# Patient Record
Sex: Female | Born: 1980 | Race: Black or African American | Hispanic: No | Marital: Single | Smoking: Never smoker
Health system: Southern US, Community
[De-identification: ages and names within clinical notes are randomized; demographics above are authoritative.]

---

## 2003-03-10 ENCOUNTER — Other Ambulatory Visit: Admission: RE | Admit: 2003-03-10 | Discharge: 2003-03-10 | Payer: Self-pay | Admitting: Internal Medicine

## 2006-11-15 ENCOUNTER — Emergency Department (HOSPITAL_COMMUNITY): Admission: EM | Admit: 2006-11-15 | Discharge: 2006-11-15 | Payer: Self-pay | Admitting: Emergency Medicine

## 2007-11-20 ENCOUNTER — Encounter (INDEPENDENT_AMBULATORY_CARE_PROVIDER_SITE_OTHER): Payer: Self-pay | Admitting: Obstetrics and Gynecology

## 2007-11-20 ENCOUNTER — Ambulatory Visit (HOSPITAL_COMMUNITY): Admission: RE | Admit: 2007-11-20 | Discharge: 2007-11-20 | Payer: Self-pay | Admitting: Obstetrics and Gynecology

## 2010-05-29 NOTE — H&P (Signed)
NAME:  Kimberly Curry, Kimberly Curry                ACCOUNT NO.:  [id number]   MEDICAL RECORD NO.:  [id number]        PATIENT TYPE:  WAMB   LOCATION:                                FACILITY:  [facility]   PHYSICIAN:  Kendra H. Tenny Craw, MD          DATE OF BIRTH:   DATE OF ADMISSION:  11/20/2007  DATE OF DISCHARGE:                              HISTORY & PHYSICAL   CHIEF COMPLAINT:  Missed abortion.   HISTORY OF PRESENT ILLNESS:  Kimberly Curry is a 30 year old G1, P0 who  presented for initiation of prenatal care on November 19, 2007.  Her last  menstrual period was September 02, 2007, which should have given her an  estimated date of confinement of Jun 08, 2008, for an estimated  gestational age of [redacted] weeks and 1 day.  An ultrasound was performed  which demonstrated an intrauterine pregnancy with a crown-rump length  measuring 9 weeks and 1 day with no perceivable fetal cardiac activity  consistent with a 9 week missed abortion.  Kimberly Curry was informed of  these findings.  Risks, benefits and alternatives of expectant  management and medical management versus surgical management were  discussed with the patient and Kimberly Curry elected to proceed with surgical  management.  Kimberly Curry presents on November 20, 2007, for suction D and C for a  9 week missed abortion.   PAST MEDICAL HISTORY:  1. Low back injury.  2. Torn knee cartilage on the left.   PAST SURGICAL HISTORY:  Wisdom teeth.   PAST OBSTETRICAL HISTORY:  Current.   PAST GYN HISTORY:  No abnormal Pap smear, no sexually transmitted  diseases.   SOCIAL HISTORY:  Quit tobacco in July of 2009, previously used alcohol  and marijuana but has since discontinued upon discovery of pregnancy.   FAMILY HISTORY:  The patient's brother has sickle cell trait.  Kimberly Curry  herself has never been tested.  No family history of birth defects or  mental retardation.  No acute defects, cystic fibrosis, Jewish or [place] descent.   PHYSICAL EXAM:  VITAL SIGNS:  Kimberly Curry is a 5 feet 8  inches tall, 178 pounds.  Blood pressure is 100/64.  GENERAL:  Kimberly Curry is alert and oriented x3 in no apparent distress.  NECK:  Neck is supple.  No thyromegaly.  LUNGS:  Clear to auscultation bilaterally.  HEART:  Regular rate and rhythm.  No murmurs, gallops or rubs.  ABDOMEN:  Soft, nontender, nondistended.  On bimanual exam uterus is  smaller than expected but normal external female genitalia.  Vagina is  rugated without lesions.  Cervix - no cervical motion tenderness.  No  lesions.  Adnexa nontender, nonpalpable bilaterally.   Transvaginal ultrasound again demonstrates the above findings of a 9  week intrauterine pregnancy with no fetal cardiac activity.  Adnexa are  normal bilaterally.   ASSESSMENT AND PLAN:  This is a 30 year old G1, P0 who presents for  suction D and C for a 9 week missed abortion.  Labs will be obtained on  the date of surgery.  Freddrick March. Tenny Craw, MD  Electronically Signed     KHR/MEDQ  D:  11/19/2007  T:  11/19/2007  Job:  [id number]

## 2010-05-29 NOTE — Op Note (Signed)
NAME:  Kimberly Curry, Kimberly Curry                ACCOUNT NO.:  [id number]   MEDICAL RECORD NO.:  [id number]          PATIENT TYPE:  AMB   LOCATION:  SDC                           FACILITY:  [facility]   PHYSICIAN:  Kendra H. Tenny Craw, MD     DATE OF BIRTH:  [birth year]   DATE OF PROCEDURE:  11/20/2007  DATE OF DISCHARGE:                               OPERATIVE REPORT   PREOPERATIVE DIAGNOSIS:  A 9-week missed abortion.   POSTOPERATIVE DIAGNOSIS:  A 9-week missed abortion.   PROCEDURE:  Suction, dilation and curettage.   SURGEON:  Freddrick March. Tenny Craw, MD   ASSISTANT:  None.   ANESTHESIA:  MAC.   OPERATIVE FINDINGS:  A 9-week size uterus with products of conception.   SPECIMENS:  Products of conception.   DISPOSITION OF SPECIMENS:  To pathology.   ESTIMATED BLOOD LOSS:  Minimal.   COMPLICATIONS:  None.   PROCEDURE:  [name] is a 30 year old G1, P0 who presented for  initiation of prenatal care on November 19, 2007.  Transvaginal  ultrasound at that time demonstrated a 9-week intrauterine pregnancy  without visible fetal cardiac activity.  Medical, surgical, versus  expectant management were discussed and she elected surgical management.  She presented today for definitive surgical management.  Prior to the  operative case, the patient wished to reverify, no fetal cardiac  activity.  A repeat transabdominal ultrasound was performed in the preop  area, which again demonstrated intrauterine pregnancy with no fetal  cardiac activity.  Following the appropriate informed consent, the  patient was brought to the operating room where she was placed in the  lithotomy position.  MAC anesthesia was administered and found to be  adequate.  She was prepped and draped in the normal sterile fashion.  A  speculum was placed in the vagina 1% plain lidocaine paracervical block  was administered and a single-toothed tenaculum was used to grasp the  anterior lip of the cervix.  The cervix was serially dilated.  A #9  suction curette was then passed transcervical with 3 suction passes and  removal of products of conception.  A sharp curettage was performed with  no further removal of products of conception.  A final suction curetting  was  performed.  This completed the procedure.  The single-toothed tenaculum  was removed from the anterior lip of the cervix.  No bleeding was noted.  The speculum was removed from the vagina.  The patient was brought to  the recovery room in stable condition following the procedure.      Freddrick March. Tenny Craw, MD  Electronically Signed     KHR/MEDQ  D:  11/20/2007  T:  11/20/2007  Job:  [id number]

## 2010-10-16 LAB — APTT: aPTT: 26

## 2010-10-16 LAB — CBC
Hemoglobin: 10.3 — ABNORMAL LOW
MCHC: 32.4
Platelets: 239
WBC: 5.5

## 2010-10-16 LAB — TYPE AND SCREEN

## 2010-10-16 LAB — PROTIME-INR: Prothrombin Time: 13.8

## 2010-10-23 LAB — CBC
HCT: 37.8
Hemoglobin: 12.5
MCHC: 33
MCV: 88.9
Platelets: 273
RDW: 14

## 2010-10-23 LAB — I-STAT 8, (EC8 V) (CONVERTED LAB)
Acid-base deficit: 3 — ABNORMAL HIGH
BUN: 7
Chloride: 108
HCT: 45
Operator id: 196461
Sodium: 140
TCO2: 22
pH, Ven: 7.386 — ABNORMAL HIGH

## 2010-10-23 LAB — POCT I-STAT CREATININE: Creatinine, Ser: 0.9

## 2010-10-23 LAB — DIFFERENTIAL: Lymphocytes Relative: 32

## 2010-10-23 LAB — POCT CARDIAC MARKERS: Operator id: 196461

## 2011-12-29 ENCOUNTER — Emergency Department (HOSPITAL_BASED_OUTPATIENT_CLINIC_OR_DEPARTMENT_OTHER): Payer: Self-pay

## 2011-12-29 ENCOUNTER — Encounter (HOSPITAL_BASED_OUTPATIENT_CLINIC_OR_DEPARTMENT_OTHER): Payer: Self-pay | Admitting: *Deleted

## 2011-12-29 ENCOUNTER — Emergency Department (HOSPITAL_BASED_OUTPATIENT_CLINIC_OR_DEPARTMENT_OTHER)
Admission: EM | Admit: 2011-12-29 | Discharge: 2011-12-29 | Disposition: A | Discharge status: Home / Self Care | Payer: Self-pay | Attending: Emergency Medicine | Admitting: Emergency Medicine

## 2011-12-29 DIAGNOSIS — R109 Unspecified abdominal pain: Secondary | ICD-10-CM | POA: Insufficient documentation

## 2011-12-29 DIAGNOSIS — R1110 Vomiting, unspecified: Secondary | ICD-10-CM

## 2011-12-29 DIAGNOSIS — R61 Generalized hyperhidrosis: Secondary | ICD-10-CM | POA: Insufficient documentation

## 2011-12-29 DIAGNOSIS — Z3202 Encounter for pregnancy test, result negative: Secondary | ICD-10-CM | POA: Insufficient documentation

## 2011-12-29 LAB — BASIC METABOLIC PANEL
Calcium: 9.2 mg/dL (ref 8.4–10.5)
Chloride: 108 mEq/L (ref 96–112)
Creatinine, Ser: 0.7 mg/dL (ref 0.50–1.10)
Potassium: 3.6 mEq/L (ref 3.5–5.1)
Sodium: 142 mEq/L (ref 135–145)

## 2011-12-29 LAB — URINALYSIS, ROUTINE W REFLEX MICROSCOPIC
Ketones, ur: 15 mg/dL — AB
Leukocytes, UA: NEGATIVE
Nitrite: NEGATIVE
Protein, ur: NEGATIVE mg/dL
Urobilinogen, UA: 0.2 mg/dL (ref 0.0–1.0)

## 2011-12-29 LAB — URINE MICROSCOPIC-ADD ON

## 2011-12-29 LAB — CBC WITH DIFFERENTIAL/PLATELET
Basophils Absolute: 0 10*3/uL (ref 0.0–0.1)
Eosinophils Absolute: 0 10*3/uL (ref 0.0–0.7)
Lymphs Abs: 1.1 10*3/uL (ref 0.7–4.0)
MCH: 28.9 pg (ref 26.0–34.0)
Monocytes Relative: 4 % (ref 3–12)
WBC: 8.7 10*3/uL (ref 4.0–10.5)

## 2011-12-29 LAB — PREGNANCY, URINE: Preg Test, Ur: NEGATIVE

## 2011-12-29 MED ORDER — PROMETHAZINE HCL 25 MG/ML IJ SOLN
12.5000 mg | Freq: Once | INTRAMUSCULAR | Status: AC
  Administered 2011-12-29: 12.5 mg via INTRAVENOUS
  Filled 2011-12-29: qty 1

## 2011-12-29 MED ORDER — ONDANSETRON 8 MG PO TBDP
8.0000 mg | ORAL_TABLET | Freq: Once | ORAL | Status: AC
  Administered 2011-12-29: 8 mg via ORAL
  Filled 2011-12-29: qty 1

## 2011-12-29 MED ORDER — SODIUM CHLORIDE 0.9 % IV BOLUS (SEPSIS)
1000.0000 mL | Freq: Once | INTRAVENOUS | Status: AC
  Administered 2011-12-29: 1000 mL via INTRAVENOUS

## 2011-12-29 MED ORDER — HYDROCODONE-ACETAMINOPHEN 5-325 MG PO TABS: 1.0000 | ORAL_TABLET | Freq: Four times a day (QID) | ORAL | Status: DC | PRN

## 2011-12-29 MED ORDER — HYDROMORPHONE HCL PF 1 MG/ML IJ SOLN
1.0000 mg | Freq: Once | INTRAMUSCULAR | Status: AC
  Administered 2011-12-29: 1 mg via INTRAVENOUS
  Filled 2011-12-29: qty 1

## 2011-12-29 MED ORDER — IOHEXOL 300 MG/ML  SOLN
25.0000 mL | INTRAMUSCULAR | Status: AC
  Administered 2011-12-29 (×2): 25 mL via ORAL

## 2011-12-29 MED ORDER — PROMETHAZINE HCL 25 MG PO TABS: 25.0000 mg | ORAL_TABLET | Freq: Four times a day (QID) | ORAL | Status: DC | PRN

## 2011-12-29 MED ORDER — SODIUM CHLORIDE 0.9 % IV SOLN: INTRAVENOUS | Status: DC

## 2011-12-29 MED ORDER — IOHEXOL 300 MG/ML  SOLN
100.0000 mL | Freq: Once | INTRAMUSCULAR | Status: AC | PRN
  Administered 2011-12-29: 100 mL via INTRAVENOUS

## 2011-12-29 NOTE — ED Notes (Signed)
MD at bedside. 

## 2011-12-29 NOTE — ED Provider Notes (Signed)
History  This chart was scribed for Kimberly Jakes, MD by Shari Heritage, ED Scribe. The patient was seen in room MH06/MH06. Patient's care was started at 1512.  CSN: [id number]  Arrival date & time 12/29/11  1423   First MD Initiated Contact with Patient 12/29/11 1512      Chief Complaint  Patient presents with  . Emesis    Patient is a 31 y.o. female presenting with vomiting and abdominal pain. The history is provided by the patient. No language interpreter was used.  Emesis  This is a new problem. The current episode started 12 to 24 hours ago. The problem occurs more than 10 times per day. The problem has not changed since onset.The emesis has an appearance of stomach contents. There has been no fever. Associated symptoms include abdominal pain and sweats. Pertinent negatives include no chills, no cough, no diarrhea, no fever and no headaches.  Abdominal Pain The primary symptoms of the illness include abdominal pain, nausea and vomiting. The primary symptoms of the illness do not include fever, shortness of breath, diarrhea, hematemesis or dysuria. The current episode started 13 to 24 hours ago. The onset of the illness was sudden. The problem has not changed since onset. The abdominal pain began 13 to24 hours ago. The pain came on suddenly. The abdominal pain has been unchanged since its onset. The abdominal pain is located in the suprapubic region. The abdominal pain does not radiate. The severity of the abdominal pain is 6/10. The abdominal pain is relieved by nothing. The abdominal pain is exacerbated by certain positions.  The vomiting began today. Vomiting occurs more than 10 times per day. The emesis contains stomach contents.  Additional symptoms associated with the illness include diaphoresis. Symptoms associated with the illness do not include chills or back pain. Significant associated medical issues do not include GERD or diabetes.     HPI Comments: Kimberly Curry is a 31  y.o. female who presents to the Emergency Department complaining of sudden onset emesis and abdominal pain onset 12.5 hours ago. There is associated nausea and diaphoresis. Patient describes the abdominal pain as cramping, constant, moderate non-radiating, suprapubic abdominal pain. Patient rates pain as 6/10. Pain is not relieved by vomiting. Patient states that she has vomited more than 12 times since the onset. There has been no blood in the emesis. Patient states that the abdominal discomfort woke early this morning and she vomited shortly after. Patient's last episode of emesis was less than 30 minutes ago in the ED. Patient was given Zofran 8 mg after triage. Patient denies diarrhea, dysuria, fever, body aches, sore throat, headaches, rhinorrhea, cough, congestion, neck pain, back pain, SOB, chest pain or rash. LMP is now. Patient has no history of blood clotting disorder. Patient has no known sick contacts at home. Patient reports no significant past medical or surgical history.   History reviewed. No pertinent family history.  History  Substance Use Topics  . Smoking status: Never Smoker   . Smokeless tobacco: Not on file  . Alcohol Use: Yes    OB History    Grav Para Term Preterm Abortions TAB SAB Ect Mult Living                  Review of Systems  Constitutional: Positive for diaphoresis. Negative for fever and chills.  HENT: Negative for congestion, sore throat, rhinorrhea and neck pain.   Eyes: Negative for visual disturbance.  Respiratory: Negative for cough and shortness of breath.  Cardiovascular: Negative for chest pain.  Gastrointestinal: Positive for nausea, vomiting and abdominal pain. Negative for diarrhea and hematemesis.  Genitourinary: Negative for dysuria.  Musculoskeletal: Negative for back pain.  Skin: Negative for rash.  Neurological: Negative for headaches.    Allergies  Review of patient's allergies indicates no known allergies.  Home Medications    Current Outpatient Rx  Name  Route  Sig  Dispense  Refill  . HYDROCODONE-ACETAMINOPHEN 5-325 MG PO TABS   Oral   Take 1-2 tablets by mouth every 6 (six) hours as needed for pain.   14 tablet   0   . PROMETHAZINE HCL 25 MG PO TABS   Oral   Take 1 tablet (25 mg total) by mouth every 6 (six) hours as needed for nausea.   12 tablet   0     Triage Vitals: BP 108/72  Pulse 60  Temp 99 F (37.2 C) (Oral)  Resp 18  Ht 5\' 9"  (1.753 m)  Wt 200 lb (90.719 kg)  BMI 29.53 kg/m2  SpO2 98%  LMP 12/29/2011  Physical Exam  Constitutional: She is oriented to person, place, and time. She appears well-developed and well-nourished.  HENT:  Head: Normocephalic and atraumatic.  Mouth/Throat: Oropharynx is clear and moist and mucous membranes are normal. No oropharyngeal exudate or posterior oropharyngeal erythema.  Eyes: Conjunctivae normal and EOM are normal. Pupils are equal, round, and reactive to light.  Neck: Neck supple.  Cardiovascular: Normal rate and regular rhythm.   No murmur heard. Pulmonary/Chest: Effort normal and breath sounds normal. No respiratory distress. She has no wheezes. She has no rales.  Abdominal: Soft. Normal appearance and bowel sounds are normal. There is tenderness. There is no rebound and no guarding.       Tenderness to lower quadrant.  Musculoskeletal: She exhibits no edema and no tenderness.  Neurological: She is alert and oriented to person, place, and time.  Skin: Skin is warm and dry. No rash noted.    ED Course  Procedures (including critical care time) DIAGNOSTIC STUDIES: Oxygen Saturation is 98% on room air, normal by my interpretation.    COORDINATION OF CARE: 3:35 PM- Patient informed of current plan for treatment and evaluation and agrees with plan at this time. Patient was given Zofran 8 mg prior to my exam at 1445. Will order CT of abdomen, CBC, basic metabolic panel, urinalysis and pregnancy test.   4:00 PM- Additional medication orders:  sodium chloride 0.9 % bolus 1,000 mL Once, HYDROmorphone (DILAUDID) injection 1 mg Once, promethazine (PHENERGAN) injection 12.5 mg Once.  Results for orders placed during the hospital encounter of 12/29/11  URINALYSIS, ROUTINE W REFLEX MICROSCOPIC      Component Value Range   Color, Urine YELLOW  YELLOW   APPearance CLOUDY (*) CLEAR   Specific Gravity, Urine 1.027  1.005 - 1.030   pH 6.5  5.0 - 8.0   Glucose, UA NEGATIVE  NEGATIVE mg/dL   Hgb urine dipstick LARGE (*) NEGATIVE   Bilirubin Urine NEGATIVE  NEGATIVE   Ketones, ur 15 (*) NEGATIVE mg/dL   Protein, ur NEGATIVE  NEGATIVE mg/dL   Urobilinogen, UA 0.2  0.0 - 1.0 mg/dL   Nitrite NEGATIVE  NEGATIVE   Leukocytes, UA NEGATIVE  NEGATIVE  PREGNANCY, URINE      Component Value Range   Preg Test, Ur NEGATIVE  NEGATIVE  CBC WITH DIFFERENTIAL      Component Value Range   WBC 8.7  4.0 - 10.5 K/uL  RBC 4.02  3.87 - 5.11 MIL/uL   Hemoglobin 11.6 (*) 12.0 - 15.0 g/dL   HCT 57.8 (*) 46.9 - 62.9 %   MCV 85.1  78.0 - 100.0 fL   MCH 28.9  26.0 - 34.0 pg   MCHC 33.9  30.0 - 36.0 g/dL   RDW 52.8  41.3 - 24.4 %   Platelets 280  150 - 400 K/uL   Neutrophils Relative 83 (*) 43 - 77 %   Neutro Abs 7.2  1.7 - 7.7 K/uL   Lymphocytes Relative 13  12 - 46 %   Lymphs Abs 1.1  0.7 - 4.0 K/uL   Monocytes Relative 4  3 - 12 %   Monocytes Absolute 0.3  0.1 - 1.0 K/uL   Eosinophils Relative 0  0 - 5 %   Eosinophils Absolute 0.0  0.0 - 0.7 K/uL   Basophils Relative 0  0 - 1 %   Basophils Absolute 0.0  0.0 - 0.1 K/uL  BASIC METABOLIC PANEL      Component Value Range   Sodium 142  135 - 145 mEq/L   Potassium 3.6  3.5 - 5.1 mEq/L   Chloride 108  96 - 112 mEq/L   CO2 21  19 - 32 mEq/L   Glucose, Bld 171 (*) 70 - 99 mg/dL   BUN 10  6 - 23 mg/dL   Creatinine, Ser 0.10  0.50 - 1.10 mg/dL   Calcium 9.2  8.4 - 27.2 mg/dL   GFR calc non Af Amer >90  >90 mL/min   GFR calc Af Amer >90  >90 mL/min  URINE MICROSCOPIC-ADD ON      Component Value Range    Squamous Epithelial / LPF FEW (*) RARE   WBC, UA 0-2  <3 WBC/hpf   RBC / HPF TOO NUMEROUS TO COUNT  <3 RBC/hpf   Bacteria, UA FEW (*) RARE    Ct Abdomen Pelvis W Contrast  12/29/2011  *RADIOLOGY REPORT*  Clinical Data: Abdominal pain and vomiting.  CT ABDOMEN AND PELVIS WITH CONTRAST  Technique:  Multidetector CT imaging of the abdomen and pelvis was performed following the standard protocol during bolus administration of intravenous contrast.  Contrast: OMNIPAQUE IOHEXOL 300 MG/ML  SOLN  Comparison: None.  Findings: The liver, spleen, pancreas, adrenal glands, and kidneys are normal.  There is at least one cholesterol stone in the nondistended gallbladder.  No bile duct dilatation.  The terminal ileum and appendix are normal.  Small bowel is normal. Colon appears normal and is devoid of stool.  Uterus and ovaries are normal except for an 11 mm cyst on the left ovary. No osseous abnormality.  IMPRESSION: Benign-appearing abdomen and pelvis except for the presence of a cholesterol gallstone.   Original Report Authenticated By: Francene Boyers, M.D.      1. Vomiting   2. Abdominal pain       MDM  Patient currently on her menses that explains the hematuria in the urine. CT scan without any significant findings. Patient with acute onset of vomiting at 3 in the morning with generalized bowel pain somewhat increased in the suprapubic area. Patient's menstrual period is on time. Progress he test was negative. Suspect that patient has a viral gastritis we'll treat with anti-medics and pain medicine will followup if not improving in the next 24 hours. Patient's abdomen is soft no significant tenderness to palpation. 80 some slight tenderness increased in the lower quadrants.      I personally performed  the services described in this documentation, which was scribed in my presence. The recorded information has been reviewed and is accurate.     Kimberly Jakes, MD 12/29/11 2012

## 2011-12-29 NOTE — ED Notes (Signed)
Vomiting since 0300. Hyperventilating at triage. Encouraged to slow breathing.

## 2012-03-31 ENCOUNTER — Encounter (HOSPITAL_BASED_OUTPATIENT_CLINIC_OR_DEPARTMENT_OTHER): Payer: Self-pay | Admitting: *Deleted

## 2012-03-31 ENCOUNTER — Emergency Department (HOSPITAL_BASED_OUTPATIENT_CLINIC_OR_DEPARTMENT_OTHER)
Admission: EM | Admit: 2012-03-31 | Discharge: 2012-04-01 | Disposition: A | Discharge status: Home / Self Care | Payer: Self-pay | Attending: Emergency Medicine | Admitting: Emergency Medicine

## 2012-03-31 DIAGNOSIS — N898 Other specified noninflammatory disorders of vagina: Secondary | ICD-10-CM | POA: Insufficient documentation

## 2012-03-31 DIAGNOSIS — N760 Acute vaginitis: Secondary | ICD-10-CM | POA: Insufficient documentation

## 2012-03-31 DIAGNOSIS — Z3202 Encounter for pregnancy test, result negative: Secondary | ICD-10-CM | POA: Insufficient documentation

## 2012-03-31 DIAGNOSIS — B9689 Other specified bacterial agents as the cause of diseases classified elsewhere: Secondary | ICD-10-CM

## 2012-03-31 DIAGNOSIS — E876 Hypokalemia: Secondary | ICD-10-CM | POA: Insufficient documentation

## 2012-03-31 DIAGNOSIS — R112 Nausea with vomiting, unspecified: Secondary | ICD-10-CM | POA: Insufficient documentation

## 2012-03-31 DIAGNOSIS — R109 Unspecified abdominal pain: Secondary | ICD-10-CM | POA: Insufficient documentation

## 2012-03-31 LAB — URINALYSIS, ROUTINE W REFLEX MICROSCOPIC
Glucose, UA: NEGATIVE mg/dL
Leukocytes, UA: NEGATIVE
Protein, ur: NEGATIVE mg/dL
Specific Gravity, Urine: 1.017 (ref 1.005–1.030)
Urobilinogen, UA: 1 mg/dL (ref 0.0–1.0)
pH: 5.5 (ref 5.0–8.0)

## 2012-03-31 LAB — CBC WITH DIFFERENTIAL/PLATELET
Basophils Absolute: 0 10*3/uL (ref 0.0–0.1)
Lymphocytes Relative: 28 % (ref 12–46)
Lymphs Abs: 2.8 10*3/uL (ref 0.7–4.0)
MCV: 85.3 fL (ref 78.0–100.0)
Neutro Abs: 6 10*3/uL (ref 1.7–7.7)
Neutrophils Relative %: 60 % (ref 43–77)
Platelets: 305 10*3/uL (ref 150–400)
RBC: 4.62 MIL/uL (ref 3.87–5.11)
RDW: 12.5 % (ref 11.5–15.5)
WBC: 9.9 10*3/uL (ref 4.0–10.5)

## 2012-03-31 LAB — COMPREHENSIVE METABOLIC PANEL
ALT: 6 U/L (ref 0–35)
AST: 11 U/L (ref 0–37)
Alkaline Phosphatase: 79 U/L (ref 39–117)
CO2: 27 mEq/L (ref 19–32)
Calcium: 9.6 mg/dL (ref 8.4–10.5)
Chloride: 96 mEq/L (ref 96–112)
GFR calc Af Amer: >90 mL/min (ref >90)
GFR calc non Af Amer: 83 mL/min — ABNORMAL LOW (ref >90)
Glucose, Bld: 111 mg/dL — ABNORMAL HIGH (ref 70–99)
Potassium: 2.8 mEq/L — ABNORMAL LOW (ref 3.5–5.1)
Sodium: 135 mEq/L (ref 135–145)

## 2012-03-31 LAB — WET PREP, GENITAL: Yeast Wet Prep HPF POC: NONE SEEN

## 2012-03-31 LAB — PREGNANCY, URINE: Preg Test, Ur: NEGATIVE

## 2012-03-31 MED ORDER — POTASSIUM CHLORIDE 10 MEQ/100ML IV SOLN
10.0000 meq | Freq: Once | INTRAVENOUS | Status: AC
  Administered 2012-03-31: 10 meq via INTRAVENOUS
  Filled 2012-03-31: qty 100

## 2012-03-31 MED ORDER — METRONIDAZOLE 500 MG PO TABS: 500.0000 mg | ORAL_TABLET | Freq: Two times a day (BID) | ORAL | Status: DC

## 2012-03-31 MED ORDER — SODIUM CHLORIDE 0.9 % IV BOLUS (SEPSIS)
1000.0000 mL | Freq: Once | INTRAVENOUS | Status: AC
  Administered 2012-03-31: 1000 mL via INTRAVENOUS

## 2012-03-31 MED ORDER — POTASSIUM CHLORIDE CRYS ER 20 MEQ PO TBCR
40.0000 meq | EXTENDED_RELEASE_TABLET | Freq: Once | ORAL | Status: AC
  Administered 2012-03-31: 40 meq via ORAL
  Filled 2012-03-31: qty 2

## 2012-03-31 MED ORDER — MORPHINE SULFATE 4 MG/ML IJ SOLN
4.0000 mg | Freq: Once | INTRAMUSCULAR | Status: AC
  Administered 2012-03-31: 4 mg via INTRAVENOUS
  Filled 2012-03-31 (×2): qty 1

## 2012-03-31 MED ORDER — METRONIDAZOLE 500 MG PO TABS
500.0000 mg | ORAL_TABLET | Freq: Once | ORAL | Status: AC
  Administered 2012-03-31: 500 mg via ORAL
  Filled 2012-03-31: qty 1

## 2012-03-31 MED ORDER — HYDROCODONE-ACETAMINOPHEN 5-325 MG PO TABS: 2.0000 | ORAL_TABLET | ORAL | Status: DC | PRN

## 2012-03-31 MED ORDER — MORPHINE SULFATE 4 MG/ML IJ SOLN
6.0000 mg | Freq: Once | INTRAMUSCULAR | Status: AC
  Administered 2012-03-31: 4 mg via INTRAVENOUS
  Filled 2012-03-31: qty 1

## 2012-03-31 MED ORDER — SODIUM CHLORIDE 0.9 % IV BOLUS (SEPSIS): 1000.0000 mL | Freq: Once | INTRAVENOUS | Status: DC

## 2012-03-31 MED ORDER — MORPHINE SULFATE 2 MG/ML IJ SOLN
INTRAMUSCULAR | Status: AC
  Administered 2012-03-31: 2 mg via INTRAVASCULAR
  Filled 2012-03-31: qty 1

## 2012-03-31 MED ORDER — PROMETHAZINE HCL 25 MG PO TABS: 25.0000 mg | ORAL_TABLET | Freq: Four times a day (QID) | ORAL | Status: DC | PRN

## 2012-03-31 MED ORDER — ONDANSETRON HCL 4 MG/2ML IJ SOLN
4.0000 mg | Freq: Once | INTRAMUSCULAR | Status: AC
  Administered 2012-03-31: 4 mg via INTRAVENOUS
  Filled 2012-03-31 (×2): qty 2

## 2012-03-31 NOTE — ED Notes (Signed)
Abdominal pain x 4 days. Vomiting.

## 2012-03-31 NOTE — ED Provider Notes (Signed)
Medical screening examination/treatment/procedure(s) were conducted as a shared visit with non-physician practitioner(s) and myself.  I personally evaluated the patient during the encounter  Kimberly Curry is a 32 y.o. female here with lower ab cramps, vomiting. Mild diffuse lower ab tenderness on exam. Vitals stable. UA nl, ucg neg. PA signed out to me to reassess patient. Patient felt better after meds. Tolerated PO. Wet prep showed + clue cells and she is given flagyl. She is also hypokalemic to 2.8, likely from vomiting. K supplemented with Kdur and KCL 10 meq. Will d/c home on norco, phenergan, and flagyl. Return precautions given.    Richardean Canal, MD 03/31/12 2250

## 2012-03-31 NOTE — ED Provider Notes (Signed)
History     CSN: [id number]  Arrival date & time 03/31/12  2057   First MD Initiated Contact with Patient 03/31/12 2106      Chief Complaint  Patient presents with  . Abdominal Pain    (Consider location/radiation/quality/duration/timing/severity/associated sxs/prior treatment) HPI Comments: Vomiting without diarrhea:pt has been afebrile  Patient is a 32 y.o. female presenting with abdominal pain. The history is provided by the patient. No language interpreter was used.  Abdominal Pain Pain location:  LLQ and RLQ Pain quality: aching   Pain radiates to:  Does not radiate Pain severity:  Moderate Onset quality:  Gradual Duration:  4 days Timing:  Constant Progression:  Unchanged Context: not previous surgeries   Relieved by:  Nothing Worsened by:  Nothing tried Ineffective treatments:  None tried Associated symptoms: nausea and vomiting   Associated symptoms: no dysuria and no vaginal discharge     History reviewed. No pertinent past medical history.  History reviewed. No pertinent past surgical history.  No family history on file.  History  Substance Use Topics  . Smoking status: Never Smoker   . Smokeless tobacco: Not on file  . Alcohol Use: Yes    OB History   Grav Para Term Preterm Abortions TAB SAB Ect Mult Living                  Review of Systems  Constitutional: Negative.   Respiratory: Negative.   Cardiovascular: Negative.   Gastrointestinal: Positive for nausea, vomiting and abdominal pain.  Genitourinary: Negative for dysuria and vaginal discharge.    Allergies  Review of patient's allergies indicates no known allergies.  Home Medications   Current Outpatient Rx  Name  Route  Sig  Dispense  Refill  . HYDROcodone-acetaminophen (NORCO/VICODIN) 5-325 MG per tablet   Oral   Take 1-2 tablets by mouth every 6 (six) hours as needed for pain.   14 tablet   0   . promethazine (PHENERGAN) 25 MG tablet   Oral   Take 1 tablet (25 mg total) by  mouth every 6 (six) hours as needed for nausea.   12 tablet   0     BP 93/54  Pulse 102  Temp(Src) 98.2 F (36.8 C) (Oral)  Resp 20  Wt 200 lb (90.719 kg)  BMI 29.52 kg/m2  SpO2 97%  LMP 03/14/2012  Physical Exam  Nursing note and vitals reviewed. Constitutional: She is oriented to person, place, and time. She appears well-developed and well-nourished.  HENT:  Head: Normocephalic and atraumatic.  Eyes: Conjunctivae and EOM are normal. Pupils are equal, round, and reactive to light.  Neck: Normal range of motion. Neck supple.  Cardiovascular: Normal rate and regular rhythm.   Pulmonary/Chest: Effort normal and breath sounds normal.  Abdominal: Soft. Bowel sounds are normal. There is no tenderness.  Genitourinary:  White malodorous discharge:-cmt   Musculoskeletal: Normal range of motion.  Neurological: She is alert and oriented to person, place, and time.  Skin: Skin is warm.  Psychiatric: She has a normal mood and affect.    ED Course  Procedures (including critical care time)  Labs Reviewed  WET PREP, GENITAL - Abnormal; Notable for the following:    Clue Cells Wet Prep HPF POC MODERATE (*)    WBC, Wet Prep HPF POC FEW (*)    All other components within normal limits  URINALYSIS, ROUTINE W REFLEX MICROSCOPIC - Abnormal; Notable for the following:    APPearance CLOUDY (*)    Ketones, ur  15 (*)    All other components within normal limits  CBC WITH DIFFERENTIAL - Abnormal; Notable for the following:    Monocytes Absolute 1.1 (*)    All other components within normal limits  COMPREHENSIVE METABOLIC PANEL - Abnormal; Notable for the following:    Potassium 2.8 (*)    Glucose, Bld 111 (*)    GFR calc non Af Amer 83 (*)    All other components within normal limits  GC/CHLAMYDIA PROBE AMP  PREGNANCY, URINE  LIPASE, BLOOD  RPR   No results found.   No diagnosis found.    MDM  Pt left with Dr. Leslie Andrea, NP 03/31/12 2224

## 2012-03-31 NOTE — ED Notes (Signed)
No active vomiting and no shortness of breath noted.

## 2012-04-02 ENCOUNTER — Emergency Department (HOSPITAL_BASED_OUTPATIENT_CLINIC_OR_DEPARTMENT_OTHER): Payer: Self-pay

## 2012-04-02 ENCOUNTER — Encounter (HOSPITAL_BASED_OUTPATIENT_CLINIC_OR_DEPARTMENT_OTHER): Payer: Self-pay

## 2012-04-02 ENCOUNTER — Inpatient Hospital Stay (HOSPITAL_BASED_OUTPATIENT_CLINIC_OR_DEPARTMENT_OTHER)
Admission: EM | Admit: 2012-04-02 | Discharge: 2012-04-04 | DRG: 419 | Disposition: A | Discharge status: Home / Self Care | Payer: MEDICAID | Attending: General Surgery | Admitting: General Surgery

## 2012-04-02 DIAGNOSIS — K819 Cholecystitis, unspecified: Secondary | ICD-10-CM

## 2012-04-02 DIAGNOSIS — B9689 Other specified bacterial agents as the cause of diseases classified elsewhere: Secondary | ICD-10-CM | POA: Diagnosis present

## 2012-04-02 DIAGNOSIS — E876 Hypokalemia: Secondary | ICD-10-CM | POA: Diagnosis present

## 2012-04-02 DIAGNOSIS — A499 Bacterial infection, unspecified: Secondary | ICD-10-CM | POA: Diagnosis present

## 2012-04-02 DIAGNOSIS — N760 Acute vaginitis: Secondary | ICD-10-CM | POA: Diagnosis present

## 2012-04-02 DIAGNOSIS — K668 Other specified disorders of peritoneum: Secondary | ICD-10-CM | POA: Diagnosis present

## 2012-04-02 DIAGNOSIS — K8000 Calculus of gallbladder with acute cholecystitis without obstruction: Principal | ICD-10-CM | POA: Diagnosis present

## 2012-04-02 DIAGNOSIS — Z79899 Other long term (current) drug therapy: Secondary | ICD-10-CM

## 2012-04-02 DIAGNOSIS — K8010 Calculus of gallbladder with chronic cholecystitis without obstruction: Secondary | ICD-10-CM

## 2012-04-02 LAB — COMPREHENSIVE METABOLIC PANEL
ALT: 7 U/L (ref 0–35)
AST: 11 U/L (ref 0–37)
Albumin: 4.2 g/dL (ref 3.5–5.2)
Alkaline Phosphatase: 74 U/L (ref 39–117)
Chloride: 99 mEq/L (ref 96–112)
Potassium: 3.8 mEq/L (ref 3.5–5.1)
Sodium: 135 mEq/L (ref 135–145)
Total Protein: 7.9 g/dL (ref 6.0–8.3)

## 2012-04-02 LAB — URINALYSIS, ROUTINE W REFLEX MICROSCOPIC
Glucose, UA: NEGATIVE mg/dL
Ketones, ur: 15 mg/dL — AB
Leukocytes, UA: NEGATIVE
Nitrite: NEGATIVE
Protein, ur: 30 mg/dL — AB
pH: 8.5 — ABNORMAL HIGH (ref 5.0–8.0)

## 2012-04-02 LAB — URINE MICROSCOPIC-ADD ON

## 2012-04-02 LAB — CBC WITH DIFFERENTIAL/PLATELET
Basophils Relative: 0 % (ref 0–1)
Eosinophils Absolute: 0 10*3/uL (ref 0.0–0.7)
MCH: 29.1 pg (ref 26.0–34.0)
MCHC: 33.7 g/dL (ref 30.0–36.0)
Neutro Abs: 6.6 10*3/uL (ref 1.7–7.7)
Neutrophils Relative %: 71 % (ref 43–77)
Platelets: 297 10*3/uL (ref 150–400)
RBC: 4.33 MIL/uL (ref 3.87–5.11)

## 2012-04-02 LAB — PREGNANCY, URINE: Preg Test, Ur: NEGATIVE

## 2012-04-02 LAB — LIPASE, BLOOD: Lipase: 26 U/L (ref 11–59)

## 2012-04-02 MED ORDER — PROMETHAZINE HCL 25 MG/ML IJ SOLN
6.2500 mg | Freq: Once | INTRAMUSCULAR | Status: AC
  Administered 2012-04-02: 6.25 mg via INTRAVENOUS
  Filled 2012-04-02: qty 1

## 2012-04-02 MED ORDER — HYDROMORPHONE HCL PF 1 MG/ML IJ SOLN
0.5000 mg | INTRAMUSCULAR | Status: DC | PRN
  Administered 2012-04-02 – 2012-04-04 (×7): 1 mg via INTRAVENOUS
  Filled 2012-04-02 (×8): qty 1

## 2012-04-02 MED ORDER — PROMETHAZINE HCL 25 MG/ML IJ SOLN
25.0000 mg | Freq: Once | INTRAMUSCULAR | Status: AC
  Administered 2012-04-02: 25 mg via INTRAVENOUS
  Filled 2012-04-02: qty 1

## 2012-04-02 MED ORDER — PROMETHAZINE HCL 25 MG/ML IJ SOLN
6.2500 mg | Freq: Four times a day (QID) | INTRAMUSCULAR | Status: DC | PRN
  Administered 2012-04-02 – 2012-04-03 (×3): 12.5 mg via INTRAVENOUS
  Filled 2012-04-02 (×3): qty 1

## 2012-04-02 MED ORDER — SODIUM CHLORIDE 0.9 % IV BOLUS (SEPSIS)
1000.0000 mL | Freq: Once | INTRAVENOUS | Status: AC
  Administered 2012-04-02: 1000 mL via INTRAVENOUS

## 2012-04-02 MED ORDER — DIPHENHYDRAMINE HCL 12.5 MG/5ML PO ELIX: 12.5000 mg | ORAL_SOLUTION | Freq: Four times a day (QID) | ORAL | Status: DC | PRN

## 2012-04-02 MED ORDER — SODIUM CHLORIDE 0.9 % IV SOLN
INTRAVENOUS | Status: DC
  Administered 2012-04-02: 14:00:00 via INTRAVENOUS

## 2012-04-02 MED ORDER — PANTOPRAZOLE SODIUM 40 MG IV SOLR
40.0000 mg | Freq: Once | INTRAVENOUS | Status: AC
  Administered 2012-04-02: 40 mg via INTRAVENOUS
  Filled 2012-04-02: qty 40

## 2012-04-02 MED ORDER — POTASSIUM CHLORIDE IN NACL 40-0.9 MEQ/L-% IV SOLN
INTRAVENOUS | Status: DC
  Administered 2012-04-02: 18:00:00 via INTRAVENOUS
  Administered 2012-04-03 (×2): 125 mL/h via INTRAVENOUS
  Filled 2012-04-02 (×4): qty 1000

## 2012-04-02 MED ORDER — DIPHENHYDRAMINE HCL 50 MG/ML IJ SOLN: 12.5000 mg | Freq: Four times a day (QID) | INTRAMUSCULAR | Status: DC | PRN

## 2012-04-02 MED ORDER — ACETAMINOPHEN 650 MG RE SUPP: 650.0000 mg | Freq: Four times a day (QID) | RECTAL | Status: DC | PRN

## 2012-04-02 MED ORDER — HYDROMORPHONE HCL PF 1 MG/ML IJ SOLN
1.0000 mg | INTRAMUSCULAR | Status: DC | PRN
Start: 2012-04-02 — End: 2012-04-02
  Administered 2012-04-02: 1 mg via INTRAVENOUS
  Filled 2012-04-02: qty 1

## 2012-04-02 MED ORDER — ACETAMINOPHEN 325 MG PO TABS: 650.0000 mg | ORAL_TABLET | Freq: Four times a day (QID) | ORAL | Status: DC | PRN

## 2012-04-02 MED ORDER — LORAZEPAM 2 MG/ML IJ SOLN
1.0000 mg | Freq: Once | INTRAMUSCULAR | Status: AC
  Administered 2012-04-02: 1 mg via INTRAVENOUS
  Filled 2012-04-02: qty 1

## 2012-04-02 MED ORDER — ONDANSETRON HCL 4 MG/2ML IJ SOLN
4.0000 mg | Freq: Once | INTRAMUSCULAR | Status: AC
  Administered 2012-04-02: 4 mg via INTRAVENOUS
  Filled 2012-04-02: qty 2

## 2012-04-02 MED ORDER — OXYCODONE HCL 5 MG PO TABS: 5.0000 mg | ORAL_TABLET | ORAL | Status: DC | PRN

## 2012-04-02 MED ORDER — IOHEXOL 300 MG/ML  SOLN
50.0000 mL | Freq: Once | INTRAMUSCULAR | Status: AC | PRN
  Administered 2012-04-02: 50 mL via ORAL

## 2012-04-02 MED ORDER — CIPROFLOXACIN IN D5W 400 MG/200ML IV SOLN
400.0000 mg | Freq: Two times a day (BID) | INTRAVENOUS | Status: DC
  Administered 2012-04-02 – 2012-04-03 (×2): 400 mg via INTRAVENOUS
  Filled 2012-04-02 (×3): qty 200

## 2012-04-02 MED ORDER — IOHEXOL 300 MG/ML  SOLN
100.0000 mL | Freq: Once | INTRAMUSCULAR | Status: AC | PRN
  Administered 2012-04-02: 100 mL via INTRAVENOUS

## 2012-04-02 MED ORDER — ONDANSETRON HCL 4 MG/2ML IJ SOLN
4.0000 mg | Freq: Three times a day (TID) | INTRAMUSCULAR | Status: AC | PRN
  Administered 2012-04-02: 4 mg via INTRAVENOUS
  Filled 2012-04-02: qty 2

## 2012-04-02 MED ORDER — ONDANSETRON HCL 4 MG/2ML IJ SOLN: 4.0000 mg | Freq: Four times a day (QID) | INTRAMUSCULAR | Status: DC | PRN

## 2012-04-02 MED ORDER — HEPARIN SODIUM (PORCINE) 5000 UNIT/ML IJ SOLN
5000.0000 [IU] | Freq: Three times a day (TID) | INTRAMUSCULAR | Status: DC
  Administered 2012-04-02: 5000 [IU] via SUBCUTANEOUS
  Filled 2012-04-02 (×5): qty 1

## 2012-04-02 NOTE — ED Notes (Signed)
Awaiting admission to [facility]

## 2012-04-02 NOTE — H&P (Signed)
Kimberly Curry is an 32 y.o. female.   Chief Complaint: Nausea,vomiting, abdominal pain HPI: This is a 32 year old female who is in good health who about a week ago she started having nausea and vomiting last Friday. Everything she drank made her sick. She was seen at [facility] last Tuesday 03/31/12 exam was normal except for some clue cells. History Flagyl but continued to have ongoing nausea and vomiting. Because of ongoing symptoms she returned to the ER at [facility]. CT scan shows gallstones seen previously on a CT scan no common bile duct dilatation. There was an omental nodule 1.2 x 0.7 cm no other nodules or nodularity noted. Appendix was normal there was no retroperitoneal adenopathy. Abdominal ultrasound shows gallbladder with stones lodged in the region the gallbladder neck measuring up to 1.2 cm. The gallbladder wall is thickened measuring up to 4.8 mm patient was tender region during scanning by the technologist. Common bile duct measures 3.2 mm. There were no significant findings but this was consistent with acute cholecystitis. Labs shows a normal white count normal LFTs. Lipase is 420 and 26 respectively. Urinalysis shows some protein and ketones but is otherwise unremarkable. Pregnancy test was negative x2. Patient was transferred to [facility] our service for cholecystectomy.  History reviewed. No pertinent past medical history.  History reviewed. No pertinent past surgical history.  Family History  Problem Relation Age of Onset  . Hypertension and a history of migraines. Her mother is also status post lap band procedure Father is living in good health he has diabetes type 2 and hypertension One brother in good health no medical issues at this time.  Mother    Social History:  reports that she has never smoked. She has never used smokeless tobacco. She reports that  drinks alcohol. She reports that she does not use illicit drugs.  Allergies:  Allergies   Allergen Reactions  . Celery Oil Hives     Medications Prior to Admission  Medication Sig Dispense Refill  . HYDROcodone-acetaminophen (NORCO) 5-325 MG per tablet Take 2 tablets by mouth every 4 (four) hours as needed for pain.  10 tablet  0  . metroNIDAZOLE (FLAGYL) 500 MG tablet Take 1 tablet (500 mg total) by mouth 2 (two) times daily. One po bid x 7 days  14 tablet  0  . promethazine (PHENERGAN) 25 MG tablet Take 1 tablet (25 mg total) by mouth every 6 (six) hours as needed for nausea.  12 tablet  0    Results for orders placed during the hospital encounter of 04/02/12 (from the past 48 hour(s))  CBC WITH DIFFERENTIAL     Status: None   Collection Time    04/02/12  8:03 AM      Result Value Range   WBC 9.3  4.0 - 10.5 K/uL   RBC 4.33  3.87 - 5.11 MIL/uL   Hemoglobin 12.6  12.0 - 15.0 g/dL   HCT 96.0  45.4 - 09.8 %   MCV 86.4  78.0 - 100.0 fL   MCH 29.1  26.0 - 34.0 pg   MCHC 33.7  30.0 - 36.0 g/dL   RDW 11.9  14.7 - 82.9 %   Platelets 297  150 - 400 K/uL   Neutrophils Relative 71  43 - 77 %   Neutro Abs 6.6  1.7 - 7.7 K/uL   Lymphocytes Relative 21  12 - 46 %   Lymphs Abs 2.0  0.7 - 4.0 K/uL   Monocytes  Relative 7  3 - 12 %   Monocytes Absolute 0.7  0.1 - 1.0 K/uL   Eosinophils Relative 0  0 - 5 %   Eosinophils Absolute 0.0  0.0 - 0.7 K/uL   Basophils Relative 0  0 - 1 %   Basophils Absolute 0.0  0.0 - 0.1 K/uL  COMPREHENSIVE METABOLIC PANEL     Status: Abnormal   Collection Time    04/02/12  8:03 AM      Result Value Range   Sodium 135  135 - 145 mEq/L   Potassium 3.8  3.5 - 5.1 mEq/L   Comment: DELTA CHECK NOTED   Chloride 99  96 - 112 mEq/L   CO2 24  19 - 32 mEq/L   Glucose, Bld 124 (*) 70 - 99 mg/dL   BUN 6  6 - 23 mg/dL   Creatinine, Ser 1.61  0.50 - 1.10 mg/dL   Calcium 9.5  8.4 - 09.6 mg/dL   Total Protein 7.9  6.0 - 8.3 g/dL   Albumin 4.2  3.5 - 5.2 g/dL   AST 11  0 - 37 U/L   ALT 7  0 - 35 U/L   Alkaline Phosphatase 74  39 - 117 U/L   Total  Bilirubin 0.3  0.3 - 1.2 mg/dL   GFR calc non Af Amer >90  >90 mL/min   GFR calc Af Amer >90  >90 mL/min   Comment:            The eGFR has been calculated     using the CKD EPI equation.     This calculation has not been     validated in all clinical     situations.     eGFR's persistently     <90 mL/min signify     possible Chronic Kidney Disease.  LIPASE, BLOOD     Status: None   Collection Time    04/02/12  8:03 AM      Result Value Range   Lipase 26  11 - 59 U/L  URINALYSIS, ROUTINE W REFLEX MICROSCOPIC     Status: Abnormal   Collection Time    04/02/12  8:43 AM      Result Value Range   Color, Urine YELLOW  YELLOW   APPearance CLEAR  CLEAR   Specific Gravity, Urine 1.015  1.005 - 1.030   pH 8.5 (*) 5.0 - 8.0   Glucose, UA NEGATIVE  NEGATIVE mg/dL   Hgb urine dipstick NEGATIVE  NEGATIVE   Bilirubin Urine NEGATIVE  NEGATIVE   Ketones, ur 15 (*) NEGATIVE mg/dL   Protein, ur 30 (*) NEGATIVE mg/dL   Urobilinogen, UA 0.2  0.0 - 1.0 mg/dL   Nitrite NEGATIVE  NEGATIVE   Leukocytes, UA NEGATIVE  NEGATIVE  PREGNANCY, URINE     Status: None   Collection Time    04/02/12  8:43 AM      Result Value Range   Preg Test, Ur NEGATIVE  NEGATIVE   Comment:            THE SENSITIVITY OF THIS     METHODOLOGY IS >20 mIU/mL.  URINE MICROSCOPIC-ADD ON     Status: None   Collection Time    04/02/12  8:43 AM      Result Value Range   Squamous Epithelial / LPF RARE  RARE   WBC, UA 0-2  <3 WBC/hpf   RBC / HPF 0-2  <3 RBC/hpf   Bacteria, UA RARE  RARE   US Abdomen Complete  04/02/2012  *RADIOLOGY REPORT*  Clinical Data:  Abdominal pain with nausea and vomiting.  COMPLETE ABDOMINAL ULTRASOUND  Comparison:  04/02/2012 CT.  Findings:  Gallbladder:  Two gallstones one of which is lodged in the region of the gallbladder neck measuring up to 1.2 cm.  Gallbladder wall thickening measuring up to 4.8 mm.  The patient was tender over this region during scanning per ultrasound technologist.  Common  bile duct:  Evaluation limited.  Measures up to 3.2 mm.  Liver:  No focal lesion identified.  Within normal limits in parenchymal echogenicity.  IVC:  Limited by bowel gas.  Pancreas:  Limited by bowel gas.  Spleen:  6.2 cm.  No focal mass.  Right Kidney:  10.5 cm. No hydronephrosis or renal mass.  Left Kidney:  10.8 cm. No hydronephrosis or renal mass.  Abdominal aorta:  Limited by bowel gas.  No obvious aneurysm.  IMPRESSION: Two gallstones one of which is lodged in the region of the gallbladder neck measuring up to 1.2 cm.  Gallbladder wall thickening measuring up to 4.8 mm.  The patient was tender over this region during scanning per ultrasound technologist. Findings are suspicious for acute cholecystitis.  Please see above.  Critical Value/emergent results were called by telephone at the time of interpretation on 04/02/2012 at 11:52 a.m. to Dr. Manus Gunning, who verbally acknowledged these results.   Original Report Authenticated By: Lacy Duverney, M.D.    Ct Abdomen Pelvis W Contrast  04/02/2012  *RADIOLOGY REPORT*  Clinical Data: Abdominal pain and vomiting.  CT ABDOMEN AND PELVIS WITH CONTRAST  Technique:  Multidetector CT imaging of the abdomen and pelvis was performed following the standard protocol during bolus administration of intravenous contrast.  Contrast: 50mL OMNIPAQUE IOHEXOL 300 MG/ML  SOLN, OMNIPAQUE IOHEXOL 300 MG/ML  SOLN  Comparison: 12/29/2011  Findings: Stable scarring anteriorly in the left lower lobe. The liver, spleen, pancreas, and adrenal glands appear unremarkable.  The gallstone previously suggested on CT scan is less readily apparent on today's exam.  No CBD dilatation noted.  The kidneys appear unremarkable, as do the proximal ureters.  There is an indistinct omental nodule measuring 1.2 by 0.7 cm on image 34 of series 2.  No other nodules or nodularity along the liver edge is identified.  Appendix normal.  No pathologic retroperitoneal adenopathy. Vascular phleboliths are  present the pelvis.  The uterus and adnexa appear unremarkable.  Urinary bladder unremarkable.  IMPRESSION:  1.  Small stable omental nodule anteriorly on the right side.  This has somewhat low density and could represent a small lymph node, cyst, or small focus of remote omental fat necrosis.  There is no other nodularity along the omentum or liver edge to suggest peritoneal spread of a neoplastic process. 2.  The gallstone seen on the CT scan from 3 months ago is a less readily apparent on today's exam. 3.  A cause for abdominal pain and vomiting is not identified.   Original Report Authenticated By: Gaylyn Rong, M.D.     Review of Systems  Constitutional: Negative.   HENT: Negative.   Eyes: Negative.   Respiratory: Negative.   Cardiovascular: Negative.   Gastrointestinal: Positive for heartburn, nausea, vomiting and abdominal pain. Negative for diarrhea, constipation, blood in stool and melena.  Genitourinary: Negative.   Musculoskeletal: Negative.   Skin: Negative.   Neurological: Negative.   Endo/Heme/Allergies: Negative.   Psychiatric/Behavioral: Negative.     Blood pressure 122/62, pulse 79, temperature  98.3 F (36.8 C), temperature source Oral, resp. rate 16, last menstrual period 03/14/2012, SpO2 100.00%. Physical Exam  Constitutional: She is oriented to person, place, and time. She appears well-developed and well-nourished. No distress.  HENT:  Head: Normocephalic and atraumatic.  Nose: Nose normal.  Eyes: Conjunctivae and EOM are normal. Pupils are equal, round, and reactive to light. Right eye exhibits no discharge. Left eye exhibits no discharge. No scleral icterus.  Neck: Normal range of motion. Neck supple. No JVD present. No tracheal deviation present. No thyromegaly present.  Cardiovascular: Normal rate, regular rhythm, normal heart sounds and intact distal pulses.  Exam reveals no gallop.   No murmur heard. Respiratory: Effort normal and breath sounds normal. No  respiratory distress. She has no wheezes. She has no rales. She exhibits no tenderness.  GI: Soft. Bowel sounds are normal. She exhibits no distension and no mass. There is tenderness (she is tender all over, but her main complaints are lower right then left abdomen). There is no rebound and no guarding.  Musculoskeletal: Normal range of motion. She exhibits edema. She exhibits no tenderness.  Lymphadenopathy:    She has no cervical adenopathy.  Neurological: She is alert and oriented to person, place, and time. No cranial nerve deficit.  Skin: Skin is warm and dry. No rash noted. She is not diaphoretic. No erythema.  Psychiatric: She has a normal mood and affect. Her behavior is normal. Judgment and thought content normal.     Assessment/Plan 1. Cholelithiasis with acute cholecystitis. 2. Bacterial vaginosis by wet prep exam 03/31/12 3. Omental nodule Plan: Hydrate the patient overnight she did have some hypokalemia as been treated by the ER. Started on IV antibiotics and plan elective cholecystectomy in the a.m.  [facility] physician assistant for Dr. Darnell Level  Danaysia Rader 04/02/2012, 4:49 PM

## 2012-04-02 NOTE — ED Notes (Signed)
Pt returned from CT °

## 2012-04-02 NOTE — ED Notes (Signed)
Pt reports onset of abdominal pain and vomiting that started Friday and continues. She was seen 3/18 in this ED for same and states she is taking prescribed medications but continues to vomit.

## 2012-04-02 NOTE — ED Notes (Signed)
Report given to Selena Batten RN at [facility].

## 2012-04-02 NOTE — ED Notes (Signed)
MD at bedside. 

## 2012-04-02 NOTE — ED Provider Notes (Signed)
History     CSN: [id number]  Arrival date & time 04/02/12  0745   First MD Initiated Contact with Patient 04/02/12 [phone]      Chief Complaint  Patient presents with  . Emesis  . Abdominal Pain    (Consider location/radiation/quality/duration/timing/severity/associated sxs/prior treatment) HPI Comments: Patient presents with nausea, vomiting and lower abdominal pain that started this morning. She was seen 2 days ago for the same. She states she felt better when she left but continued to vomit this morning and has vomited 4 or 5 times. She was able to yesterday. Denies any fevers, chills, dysuria hematuria. No diarrhea. No sick contacts. She took Phenergan at home without relief. She states she's been sick for the past 6 days but did feel better when she left here 2 days ago.  The history is provided by the patient.    History reviewed. No pertinent past medical history.  History reviewed. No pertinent past surgical history.  No family history on file.  History  Substance Use Topics  . Smoking status: Never Smoker   . Smokeless tobacco: Not on file  . Alcohol Use: Yes     Comment: occasional    OB History   Grav Para Term Preterm Abortions TAB SAB Ect Mult Living                  Review of Systems  Constitutional: Positive for appetite change and fatigue. Negative for fever.  HENT: Negative for congestion and rhinorrhea.   Respiratory: Negative for cough, chest tightness and shortness of breath.   Cardiovascular: Negative for chest pain.  Gastrointestinal: Positive for nausea, vomiting and abdominal pain. Negative for diarrhea.  Genitourinary: Negative for dysuria, vaginal bleeding and vaginal discharge.  Musculoskeletal: Negative for back pain.  Skin: Negative for rash.  Neurological: Negative for dizziness, weakness and headaches.  A complete 10 system review of systems was obtained and all systems are negative except as noted in the HPI and PMH.    Allergies   Review of patient's allergies indicates no known allergies.  Home Medications   Current Outpatient Rx  Name  Route  Sig  Dispense  Refill  . HYDROcodone-acetaminophen (NORCO) 5-325 MG per tablet   Oral   Take 2 tablets by mouth every 4 (four) hours as needed for pain.   10 tablet   0   . HYDROcodone-acetaminophen (NORCO/VICODIN) 5-325 MG per tablet   Oral   Take 1-2 tablets by mouth every 6 (six) hours as needed for pain.   14 tablet   0   . metroNIDAZOLE (FLAGYL) 500 MG tablet   Oral   Take 1 tablet (500 mg total) by mouth 2 (two) times daily. One po bid x 7 days   14 tablet   0   . promethazine (PHENERGAN) 25 MG tablet   Oral   Take 1 tablet (25 mg total) by mouth every 6 (six) hours as needed for nausea.   12 tablet   0   . promethazine (PHENERGAN) 25 MG tablet   Oral   Take 1 tablet (25 mg total) by mouth every 6 (six) hours as needed for nausea.   15 tablet   0     BP 124/58  Pulse 72  Temp(Src) 98.4 F (36.9 C) (Oral)  Resp 16  SpO2 100%  LMP 03/14/2012  Physical Exam  Constitutional: She is oriented to person, place, and time. She appears well-developed and well-nourished. No distress.  HENT:  Head: Normocephalic and  atraumatic.  Mouth/Throat: Oropharynx is clear and moist. No oropharyngeal exudate.  Moist mucous membranes  Eyes: Conjunctivae and EOM are normal. Pupils are equal, round, and reactive to light.  Neck: Normal range of motion. Neck supple.  Cardiovascular: Normal rate, regular rhythm and normal heart sounds.   No murmur heard. Pulmonary/Chest: Effort normal and breath sounds normal. No respiratory distress.  Abdominal: Soft. There is tenderness. There is no rebound and no guarding.  Lower abdominal tenderness without peritoneal signs Negative murphy's sign. No pain at McBurney's point.  Musculoskeletal: Normal range of motion. She exhibits no edema and no tenderness.  Neurological: She is alert and oriented to person, place, and time.  No cranial nerve deficit. She exhibits normal muscle tone. Coordination normal.  Skin: Skin is warm.    ED Course  Procedures (including critical care time)  Labs Reviewed  URINALYSIS, ROUTINE W REFLEX MICROSCOPIC - Abnormal; Notable for the following:    pH 8.5 (*)    Ketones, ur 15 (*)    Protein, ur 30 (*)    All other components within normal limits  COMPREHENSIVE METABOLIC PANEL - Abnormal; Notable for the following:    Glucose, Bld 124 (*)    All other components within normal limits  PREGNANCY, URINE  CBC WITH DIFFERENTIAL  LIPASE, BLOOD  URINE MICROSCOPIC-ADD ON   US Abdomen Complete  04/02/2012  *RADIOLOGY REPORT*  Clinical Data:  Abdominal pain with nausea and vomiting.  COMPLETE ABDOMINAL ULTRASOUND  Comparison:  04/02/2012 CT.  Findings:  Gallbladder:  Two gallstones one of which is lodged in the region of the gallbladder neck measuring up to 1.2 cm.  Gallbladder wall thickening measuring up to 4.8 mm.  The patient was tender over this region during scanning per ultrasound technologist.  Common bile duct:  Evaluation limited.  Measures up to 3.2 mm.  Liver:  No focal lesion identified.  Within normal limits in parenchymal echogenicity.  IVC:  Limited by bowel gas.  Pancreas:  Limited by bowel gas.  Spleen:  6.2 cm.  No focal mass.  Right Kidney:  10.5 cm. No hydronephrosis or renal mass.  Left Kidney:  10.8 cm. No hydronephrosis or renal mass.  Abdominal aorta:  Limited by bowel gas.  No obvious aneurysm.  IMPRESSION: Two gallstones one of which is lodged in the region of the gallbladder neck measuring up to 1.2 cm.  Gallbladder wall thickening measuring up to 4.8 mm.  The patient was tender over this region during scanning per ultrasound technologist. Findings are suspicious for acute cholecystitis.  Please see above.  Critical Value/emergent results were called by telephone at the time of interpretation on 04/02/2012 at 11:52 a.m. to Dr. Manus Gunning, who verbally acknowledged these  results.   Original Report Authenticated By: Lacy Duverney, M.D.    Ct Abdomen Pelvis W Contrast  04/02/2012  *RADIOLOGY REPORT*  Clinical Data: Abdominal pain and vomiting.  CT ABDOMEN AND PELVIS WITH CONTRAST  Technique:  Multidetector CT imaging of the abdomen and pelvis was performed following the standard protocol during bolus administration of intravenous contrast.  Contrast: 50mL OMNIPAQUE IOHEXOL 300 MG/ML  SOLN, OMNIPAQUE IOHEXOL 300 MG/ML  SOLN  Comparison: 12/29/2011  Findings: Stable scarring anteriorly in the left lower lobe. The liver, spleen, pancreas, and adrenal glands appear unremarkable.  The gallstone previously suggested on CT scan is less readily apparent on today's exam.  No CBD dilatation noted.  The kidneys appear unremarkable, as do the proximal ureters.  There is an indistinct omental  nodule measuring 1.2 by 0.7 cm on image 34 of series 2.  No other nodules or nodularity along the liver edge is identified.  Appendix normal.  No pathologic retroperitoneal adenopathy. Vascular phleboliths are present the pelvis.  The uterus and adnexa appear unremarkable.  Urinary bladder unremarkable.  IMPRESSION:  1.  Small stable omental nodule anteriorly on the right side.  This has somewhat low density and could represent a small lymph node, cyst, or small focus of remote omental fat necrosis.  There is no other nodularity along the omentum or liver edge to suggest peritoneal spread of a neoplastic process. 2.  The gallstone seen on the CT scan from 3 months ago is a less readily apparent on today's exam. 3.  A cause for abdominal pain and vomiting is not identified.   Original Report Authenticated By: Gaylyn Rong, M.D.      1. Cholecystitis       MDM  Nausea, vomiting, abdominal pain for the past 6 days, with improvement and now with recurrence this morning. Vital stable, no distress, no tachycardia. Moist mucous membranes. Abdomen soft and nonsurgical.  No ketones in urine  without infection. LFTs and lipase normal. No leukocytosis. Given patient's second visit with recurrent vomiting and abdominal pain, imaging pursued. CT scan shows omental nodule that appears to be stable.  Ultrasound shows multiple gallstones with gallbladder wall thickening. Patient does have right upper quadrant tenderness on exam at this time.  Discussed with Dr. Gerrit Friends who agrees to admit patient for cholecystitis directly to [facility] long. Patient and family in agreement.     Glynn Octave, MD 04/02/12 1316

## 2012-04-02 NOTE — ED Notes (Signed)
[facility] has been notified of room 1305 to [employer]

## 2012-04-02 NOTE — Progress Notes (Signed)
General Surgery [facility]  Patient discussed with Dr. Manus Gunning at [facility].  Accepted as transfer to floor.  History reviewed.  Patient examined.  Discussed results of studies and plans for operative intervention with patient and her mother at the bedside.  Plan to proceed tomorrow with lap chole with IOC.  Discussed possible need for open surgery.  Patient understands and wishes to proceed.  Will Rx nausea and pain tonight.  Start empiric antibiotics.  The risks and benefits of the procedure have been discussed at length with the patient.  The patient understands the proposed procedure, potential alternative treatments, and the course of recovery to be expected.  All of the patient's questions have been answered at this time.  The patient wishes to proceed with surgery.  Velora Heckler, MD, [facility] Office: [phone]

## 2012-04-02 NOTE — ED Notes (Signed)
S/W Cordelia Pen in lab, states GC/CH vail was received at [facility] lab center and did not have a swab present.  States tests were cancelled.  Update given to Dr. Manus Gunning.

## 2012-04-02 NOTE — ED Notes (Signed)
Patient transported to Ultrasound 

## 2012-04-03 ENCOUNTER — Inpatient Hospital Stay (HOSPITAL_COMMUNITY): Payer: MEDICAID

## 2012-04-03 ENCOUNTER — Encounter (HOSPITAL_COMMUNITY): Payer: Self-pay | Admitting: Anesthesiology

## 2012-04-03 ENCOUNTER — Encounter (HOSPITAL_COMMUNITY): Admission: EM | Disposition: A | Discharge status: Home / Self Care | Payer: Self-pay | Source: Home / Self Care

## 2012-04-03 ENCOUNTER — Inpatient Hospital Stay (HOSPITAL_COMMUNITY): Payer: Self-pay | Admitting: Anesthesiology

## 2012-04-03 DIAGNOSIS — K8010 Calculus of gallbladder with chronic cholecystitis without obstruction: Secondary | ICD-10-CM | POA: Insufficient documentation

## 2012-04-03 DIAGNOSIS — Z9049 Acquired absence of other specified parts of digestive tract: Secondary | ICD-10-CM | POA: Insufficient documentation

## 2012-04-03 HISTORY — PX: CHOLECYSTECTOMY: SHX55

## 2012-04-03 LAB — COMPREHENSIVE METABOLIC PANEL
ALT: 5 U/L (ref 0–35)
AST: 8 U/L (ref 0–37)
Albumin: 3.1 g/dL — ABNORMAL LOW (ref 3.5–5.2)
CO2: 23 mEq/L (ref 19–32)
Calcium: 8.7 mg/dL (ref 8.4–10.5)
GFR calc non Af Amer: >90 mL/min (ref >90)
Sodium: 132 mEq/L — ABNORMAL LOW (ref 135–145)
Total Protein: 6.1 g/dL (ref 6.0–8.3)

## 2012-04-03 LAB — CBC
HCT: 32.1 % — ABNORMAL LOW (ref 36.0–46.0)
Hemoglobin: 10.7 g/dL — ABNORMAL LOW (ref 12.0–15.0)
MCH: 29.3 pg (ref 26.0–34.0)
MCHC: 33.3 g/dL (ref 30.0–36.0)
MCV: 87.9 fL (ref 78.0–100.0)

## 2012-04-03 SURGERY — LAPAROSCOPIC CHOLECYSTECTOMY WITH INTRAOPERATIVE CHOLANGIOGRAM
Anesthesia: General | Wound class: Clean

## 2012-04-03 MED ORDER — PROPOFOL 10 MG/ML IV BOLUS
INTRAVENOUS | Status: DC | PRN
  Administered 2012-04-03: 150 mg via INTRAVENOUS

## 2012-04-03 MED ORDER — IOHEXOL 300 MG/ML  SOLN
INTRAMUSCULAR | Status: DC | PRN
  Administered 2012-04-03 (×2): 10 mL via INTRAVENOUS

## 2012-04-03 MED ORDER — PROMETHAZINE HCL 25 MG/ML IJ SOLN: 6.2500 mg | INTRAMUSCULAR | Status: DC | PRN

## 2012-04-03 MED ORDER — ONDANSETRON HCL 4 MG/2ML IJ SOLN
INTRAMUSCULAR | Status: DC | PRN
  Administered 2012-04-03: 4 mg via INTRAVENOUS

## 2012-04-03 MED ORDER — KCL IN DEXTROSE-NACL 20-5-0.45 MEQ/L-%-% IV SOLN
INTRAVENOUS | Status: DC
  Administered 2012-04-04: via INTRAVENOUS
  Filled 2012-04-03 (×2): qty 1000

## 2012-04-03 MED ORDER — ACETAMINOPHEN 10 MG/ML IV SOLN
INTRAVENOUS | Status: DC | PRN
  Administered 2012-04-03: 1000 mg via INTRAVENOUS

## 2012-04-03 MED ORDER — FENTANYL CITRATE 0.05 MG/ML IJ SOLN
INTRAMUSCULAR | Status: DC | PRN
  Administered 2012-04-03: 100 ug via INTRAVENOUS
  Administered 2012-04-03: 50 ug via INTRAVENOUS
  Administered 2012-04-03: 100 ug via INTRAVENOUS

## 2012-04-03 MED ORDER — HYDROMORPHONE HCL PF 1 MG/ML IJ SOLN
0.2500 mg | INTRAMUSCULAR | Status: DC | PRN
  Administered 2012-04-03 (×2): 0.5 mg via INTRAVENOUS

## 2012-04-03 MED ORDER — 0.9 % SODIUM CHLORIDE (POUR BTL) OPTIME
TOPICAL | Status: DC | PRN
  Administered 2012-04-03: 1000 mL

## 2012-04-03 MED ORDER — LACTATED RINGERS IV SOLN
INTRAVENOUS | Status: DC | PRN
  Administered 2012-04-03: 1000 mL via INTRAVENOUS

## 2012-04-03 MED ORDER — LACTATED RINGERS IV SOLN
INTRAVENOUS | Status: DC
Start: 2012-04-03 — End: 2012-04-03
  Administered 2012-04-03: 1000 mL via INTRAVENOUS

## 2012-04-03 MED ORDER — LIDOCAINE HCL (CARDIAC) 20 MG/ML IV SOLN
INTRAVENOUS | Status: DC | PRN
  Administered 2012-04-03: 50 mg via INTRAVENOUS

## 2012-04-03 MED ORDER — LACTATED RINGERS IV SOLN
INTRAVENOUS | Status: DC | PRN
  Administered 2012-04-03 (×2): via INTRAVENOUS

## 2012-04-03 MED ORDER — MIDAZOLAM HCL 5 MG/5ML IJ SOLN
INTRAMUSCULAR | Status: DC | PRN
  Administered 2012-04-03: 2 mg via INTRAVENOUS

## 2012-04-03 MED ORDER — GLYCOPYRROLATE 0.2 MG/ML IJ SOLN
INTRAMUSCULAR | Status: DC | PRN
  Administered 2012-04-03: 0.6 mg via INTRAVENOUS

## 2012-04-03 MED ORDER — ROCURONIUM BROMIDE 100 MG/10ML IV SOLN
INTRAVENOUS | Status: DC | PRN
  Administered 2012-04-03: 40 mg via INTRAVENOUS

## 2012-04-03 MED ORDER — HYDROCODONE-ACETAMINOPHEN 5-325 MG PO TABS
2.0000 | ORAL_TABLET | ORAL | Status: DC | PRN
  Administered 2012-04-03: 2 via ORAL
  Filled 2012-04-03: qty 2

## 2012-04-03 MED ORDER — HYDROMORPHONE HCL PF 1 MG/ML IJ SOLN
INTRAMUSCULAR | Status: DC | PRN
  Administered 2012-04-03 (×2): 1 mg via INTRAVENOUS

## 2012-04-03 MED ORDER — NEOSTIGMINE METHYLSULFATE 1 MG/ML IJ SOLN
INTRAMUSCULAR | Status: DC | PRN
  Administered 2012-04-03: 4 mg via INTRAVENOUS

## 2012-04-03 MED ORDER — BUPIVACAINE-EPINEPHRINE 0.5% -1:200000 IJ SOLN
INTRAMUSCULAR | Status: DC | PRN
  Administered 2012-04-03: 20 mL

## 2012-04-03 SURGICAL SUPPLY — 37 items
APPLIER CLIP ROT 10 11.4 M/L (STAPLE) ×2
BENZOIN TINCTURE PRP APPL 2/3 (GAUZE/BANDAGES/DRESSINGS) ×2 IMPLANT
CABLE HIGH FREQUENCY MONO STRZ (ELECTRODE) ×2 IMPLANT
CANISTER SUCTION 2500CC (MISCELLANEOUS) ×2 IMPLANT
CHLORAPREP W/TINT 26ML (MISCELLANEOUS) ×2 IMPLANT
CLIP APPLIE ROT 10 11.4 M/L (STAPLE) ×1 IMPLANT
CLOTH BEACON ORANGE TIMEOUT ST (SAFETY) ×2 IMPLANT
COVER MAYO STAND STRL (DRAPES) ×2 IMPLANT
DECANTER SPIKE VIAL GLASS SM (MISCELLANEOUS) ×2 IMPLANT
DRAPE C-ARM 42X72 X-RAY (DRAPES) ×2 IMPLANT
DRAPE LAPAROSCOPIC ABDOMINAL (DRAPES) ×2 IMPLANT
DRAPE UTILITY 15X26 (DRAPE) ×2 IMPLANT
ELECT REM PT RETURN 9FT ADLT (ELECTROSURGICAL) ×2
ELECTRODE REM PT RTRN 9FT ADLT (ELECTROSURGICAL) ×1 IMPLANT
GLOVE BIOGEL PI IND STRL 7.0 (GLOVE) ×1 IMPLANT
GLOVE BIOGEL PI INDICATOR 7.0 (GLOVE) ×1
GLOVE SURG ORTHO 8.0 STRL STRW (GLOVE) ×2 IMPLANT
GOWN STRL NON-REIN LRG LVL3 (GOWN DISPOSABLE) ×2 IMPLANT
GOWN STRL REIN XL XLG (GOWN DISPOSABLE) ×4 IMPLANT
HEMOSTAT SURGICEL 4X8 (HEMOSTASIS) IMPLANT
KIT BASIN OR (CUSTOM PROCEDURE TRAY) ×2 IMPLANT
NS IRRIG 1000ML POUR BTL (IV SOLUTION) ×2 IMPLANT
POUCH SPECIMEN RETRIEVAL 10MM (ENDOMECHANICALS) ×2 IMPLANT
SCISSORS LAP 5X35 DISP (ENDOMECHANICALS) IMPLANT
SET CHOLANGIOGRAPH MIX (MISCELLANEOUS) ×4 IMPLANT
SET IRRIG TUBING LAPAROSCOPIC (IRRIGATION / IRRIGATOR) ×2 IMPLANT
SLEEVE Z-THREAD 5X100MM (TROCAR) ×2 IMPLANT
SOLUTION ANTI FOG 6CC (MISCELLANEOUS) ×2 IMPLANT
STRIP CLOSURE SKIN 1/2X4 (GAUZE/BANDAGES/DRESSINGS) ×2 IMPLANT
SUT MNCRL AB 4-0 PS2 18 (SUTURE) ×2 IMPLANT
TOWEL OR 17X26 10 PK STRL BLUE (TOWEL DISPOSABLE) ×6 IMPLANT
TRAY LAP CHOLE (CUSTOM PROCEDURE TRAY) ×2 IMPLANT
TROCAR BLADELESS OPT 5 100 (ENDOMECHANICALS) ×4 IMPLANT
TROCAR XCEL BLUNT TIP 100MML (ENDOMECHANICALS) ×2 IMPLANT
TROCAR Z-THREAD FIOS 11X100 BL (TROCAR) ×2 IMPLANT
TROCAR Z-THREAD FIOS 5X100MM (TROCAR) ×4 IMPLANT
TUBING INSUFFLATION 10FT LAP (TUBING) ×2 IMPLANT

## 2012-04-03 NOTE — H&P (Signed)
General Surgery [facility]  See previous note.  Agree with Will Marlyne Beards assessment.  Plan OR for lap chole with IOC.  Velora Heckler, MD, [facility] Office: [phone]

## 2012-04-03 NOTE — Op Note (Signed)
Procedure Note  Pre-operative Diagnosis: Calculus of gallbladder with other cholecystitis, without mention of obstruction  Post-operative Diagnosis: Same  Surgeon:  Velora Heckler, MD, FACS  Procedure:  Laparoscopic cholecystectomy with intra-operative cholangiography  Assistant:  none   Anesthesia:  General  Indications: This patient presents with symptomatic gallbladder disease and will undergo laparoscopic cholecystectomy with intraoperative cholangiography.  Procedure Details: The patient was seen in the pre-op holding area. The risks, benefits, complications, treatment options, and expected outcomes have been discussed with the patient. The patient and/or family agreed with the proposed plan and signed the informed consent form.  The patient was taken to Operating Room, identified as Kimberly Curry and the procedure verified as Laparoscopic Cholecystectomy with Intraoperative Cholangiogram. A "time out" was completed and the above information confirmed.  Prior to the induction of general anesthesia, antibiotic prophylaxis was administered. General endotracheal anesthesia was then administered and tolerated well. After the induction, the abdomen was prepped in the usual strict aseptic fashion. The patient was in the supine position.  An incision was made in the skin near the umbilicus. The midline fascia was incised and the peritoneal cavity entered and the Hasson canula was introduced under direct vision. Hasson canula was secured with a pursestring 0-Vicryl suture. Pneumoperitoneum was then established with carbon dioxide and tolerated well without any adverse changes in the patient's vital signs. Additional trocars were introduced under direct vision along the right costal margin in the midline, mid-clavicular line, and anterior axillary line.  The gallbladder was identified and the fundus grasped and retracted cephalad. Adhesions were taken down bluntly and with the electrocautery as  needed, taking care not to injure any adjacent structures. The infundibulum was grasped and retracted laterally, exposing the peritoneum overlying the triangle of Calot. This was incised and structures exposed in a blunt fashion. The cystic duct was clearly identified and bluntly dissected circumferentially and clipped at the neck of the gallbladder.  An incision was made in the cystic duct and the cholangiogram catheter introduced. The catheter was secured using an ligaclip.  Real-time cholangiography was performed using the C-arm.  There was rapid filling of a normal caliber common bile duct.  There was reflux of contrast into the left and right hepatic ductal systems.  There was free flow distally into the duodenum without filling defect or obstruction.  Catheter was removed from the peritoneal cavity.  The cystic duct was then triply ligated with surgical clips and divided. The cystic artery was identified, dissected circumferentially, ligated with ligaclips, and divided.   The gallbladder was dissected from the liver bed with the electrocautery used for hemostasis. The gallbladder was completely removed and placed into an endocatch bag. The right upper quadrant was irrigated and inspected. Hemostasis was achieved with the electrocautery. Warm saline irrigation was utilized and was repeatedly aspirated until clear.  Pneumoperitoneum was released after viewing removal of the trocars with good hemostasis noted. The umbilical wound was irrigated and the fascia was then closed with the pursestring suture.  The skin was then closed with 4-0 Monocril subcuticular sutures and sterile dressings were applied.  Instrument, sponge, and needle counts were correct at the conclusion of the case.  The patient tolerated the procedure well.  Estimated Blood Loss: Minimal         Drains: none         Specimens: Gallbladder to pathology         Disposition: PACU - hemodynamically stable.         Condition:  stable   Velora Heckler, MD, [facility] Office: [phone]

## 2012-04-03 NOTE — Anesthesia Postprocedure Evaluation (Signed)
  Anesthesia Post-op Note  Patient: Museum/gallery curator) Performed: Procedure(s) (LRB): LAPAROSCOPIC CHOLECYSTECTOMY WITH INTRAOPERATIVE CHOLANGIOGRAM (N/A)  Patient Location: PACU  Anesthesia Type: General  Level of Consciousness: awake and alert   Airway and Oxygen Therapy: Patient Spontanous Breathing  Post-op Pain: mild  Post-op Assessment: Post-op Vital signs reviewed, Patient's Cardiovascular Status Stable, Respiratory Function Stable, Patent Airway and No signs of Nausea or vomiting  Last Vitals:  Filed Vitals:   04/03/12 1700  BP:   Pulse: 74  Temp:   Resp: 17    Post-op Vital Signs: stable   Complications: No apparent anesthesia complications

## 2012-04-03 NOTE — Care Management (Signed)
CARE MANAGEMENT NOTE 04/03/2012  Patient:  Kimberly Curry,Kimberly Curry   Account Number:  [id number]  Date Initiated:  04/03/2012  Documentation initiated by:  Lenya Sterne  Subjective/Objective Assessment:   32 yo female admitted with Cholelithiasis with acute cholecystiti. Planned lap-choley. PTA pt independent     Action/Plan:   Home when stable   Anticipated DC Date:     Anticipated DC Plan:  HOME/SELF CARE  In-house referral  Artist      DC Planning Services  CM consult      Choice offered to / List presented to:             Status of service:  In process, will continue to follow Medicare Important Message given?   (If response is "NO", the following Medicare IM given date fields will be blank) Date Medicare IM given:   Date Additional Medicare IM given:    Discharge Disposition:    Per UR Regulation:  Reviewed for med. necessity/level of care/duration of stay  If discussed at Long Length of Stay Meetings, dates discussed:    Comments:  04/03/12 Leonie Green [phone] Cm contacted Financial Counselor for information concerning potential medicaid assistance. Pt provided with information concerning community resources and PCP follow-up.

## 2012-04-03 NOTE — Preoperative (Signed)
Beta Blockers   Reason not to administer Beta Blockers:Not Applicable 

## 2012-04-03 NOTE — Transfer of Care (Signed)
Immediate Anesthesia Transfer of Care Note  Patient: [employer]  Procedure(s) Performed: Procedure(s): LAPAROSCOPIC CHOLECYSTECTOMY WITH INTRAOPERATIVE CHOLANGIOGRAM (N/A)  Patient Location: PACU  Anesthesia Type:General  Level of Consciousness: awake, alert , oriented and patient cooperative  Airway & Oxygen Therapy: Patient Spontanous Breathing and Patient connected to face mask oxygen  Post-op Assessment: Report given to PACU RN, Post -op Vital signs reviewed and stable and Patient moving all extremities X 4  Post vital signs: Reviewed and stable  Complications: No apparent anesthesia complications

## 2012-04-03 NOTE — Anesthesia Preprocedure Evaluation (Addendum)
Anesthesia Evaluation  Patient identified by MRN, date of birth, ID band Patient awake    Reviewed: Allergy & Precautions, H&P , NPO status , Patient's Chart, lab work & pertinent test results, reviewed documented beta blocker date and time   Airway Mallampati: II TM Distance: >3 FB Neck ROM: full    Dental no notable dental hx.    Pulmonary neg pulmonary ROS,  breath sounds clear to auscultation  Pulmonary exam normal       Cardiovascular Exercise Tolerance: Good negative cardio ROS  Rhythm:regular Rate:Normal     Neuro/Psych negative neurological ROS  negative psych ROS   GI/Hepatic negative GI ROS, Neg liver ROS,   Endo/Other  negative endocrine ROS  Renal/GU negative Renal ROS  negative genitourinary   Musculoskeletal   Abdominal   Peds  Hematology negative hematology ROS (+)   Anesthesia Other Findings   Reproductive/Obstetrics Pregnancy test negative.                          Anesthesia Physical Anesthesia Plan  ASA: II  Anesthesia Plan: General ETT   Post-op Pain Management:    Induction:   Airway Management Planned:   Additional Equipment:   Intra-op Plan:   Post-operative Plan:   Informed Consent: I have reviewed the patients History and Physical, chart, labs and discussed the procedure including the risks, benefits and alternatives for the proposed anesthesia with the patient or authorized representative who has indicated his/her understanding and acceptance.   Dental Advisory Given  Plan Discussed with: CRNA  Anesthesia Plan Comments:         Anesthesia Quick Evaluation

## 2012-04-04 MED ORDER — HYDROCODONE-ACETAMINOPHEN 5-325 MG PO TABS: 2.0000 | ORAL_TABLET | ORAL | Status: DC | PRN

## 2012-04-04 NOTE — Discharge Summary (Signed)
   Patient ID: Kimberly Curry [id number] 32 y.o. [birth year]  04/02/2012  Discharge date and time: 04/04/2012   Admitting Physician: Darnell Level  Discharge Physician: Glenna Fellows T  Admission Diagnoses: Cholecystitis [575.10]  Discharge Diagnoses: Same  Operations: Procedure(s): LAPAROSCOPIC CHOLECYSTECTOMY WITH INTRAOPERATIVE CHOLANGIOGRAM  Admission Condition: fair  Discharged Condition: good  Indication for Admission: patient is a generally healthy 32 year old female who presented with acute upper abdominal pain. Evaluation included a CT scan showing evidence of early cholecystitis and cholelithiasis.   Hospital Course: patient was admitted and underwent an uneventful laparoscopic cholecystectomy. On the first postoperative morning she had some expected soreness but her pain was relieved. She is tolerating a liquid diet. Her abdomen is soft and nontender. Vital signs are within normal limits. Dressings are clean and dry.   Disposition: Home  Patient Instructions:    Medication List    STOP taking these medications       metroNIDAZOLE 500 MG tablet  Commonly known as:  FLAGYL     promethazine 25 MG tablet  Commonly known as:  PHENERGAN      TAKE these medications       HYDROcodone-acetaminophen 5-325 MG per tablet  Commonly known as:  NORCO  Take 2 tablets by mouth every 4 (four) hours as needed for pain.     HYDROcodone-acetaminophen 5-325 MG per tablet  Commonly known as:  NORCO  Take 2 tablets by mouth every 4 (four) hours as needed.        Activity: activity as tolerated Diet: low fat, low cholesterol diet Wound Care: none needed  Follow-up:  With Dr. Gerrit Friends in 2 weeks.  Signed: Mariella Saa MD, FACS  04/04/2012, 8:10 AM

## 2012-04-04 NOTE — Progress Notes (Signed)
Pt. Was discharged home. Discharge instructions and prescriptions were given to the pt. She was taken home by her mother.

## 2012-04-04 NOTE — Progress Notes (Signed)
Utilization review complete 

## 2012-04-06 ENCOUNTER — Encounter (HOSPITAL_COMMUNITY): Payer: Self-pay | Admitting: Surgery

## 2012-04-21 ENCOUNTER — Telehealth (INDEPENDENT_AMBULATORY_CARE_PROVIDER_SITE_OTHER): Payer: Self-pay | Admitting: General Surgery

## 2012-04-21 ENCOUNTER — Ambulatory Visit (INDEPENDENT_AMBULATORY_CARE_PROVIDER_SITE_OTHER): Payer: No Typology Code available for payment source | Admitting: General Surgery

## 2012-04-21 ENCOUNTER — Encounter (INDEPENDENT_AMBULATORY_CARE_PROVIDER_SITE_OTHER): Payer: Self-pay

## 2012-04-21 ENCOUNTER — Encounter (INDEPENDENT_AMBULATORY_CARE_PROVIDER_SITE_OTHER): Payer: Self-pay | Admitting: General Surgery

## 2012-04-21 VITALS — BP 120/82 | HR 97 | Temp 98.2°F | Resp 16 | Ht 68.5 in | Wt 189.4 lb

## 2012-04-21 DIAGNOSIS — Z9889 Other specified postprocedural states: Secondary | ICD-10-CM

## 2012-04-21 DIAGNOSIS — Z9049 Acquired absence of other specified parts of digestive tract: Secondary | ICD-10-CM

## 2012-04-21 DIAGNOSIS — K8010 Calculus of gallbladder with chronic cholecystitis without obstruction: Secondary | ICD-10-CM

## 2012-04-21 NOTE — Patient Instructions (Addendum)
She may resume a regular diet and normal activity.  She may follow-up on a PRN basis.  She can return to work on Monday April 14th 2014 light duty (no lifting/pushing/pulling >40lbs until 6 weeks post op).  After 6 weeks no restrictions.

## 2012-04-21 NOTE — Telephone Encounter (Signed)
Called to see if patient could come in a little sooner for today's appt and she stated that it would be not problem...she would be here between 12:15-12:30 04/21/12

## 2012-04-21 NOTE — Progress Notes (Signed)
  Subjective: Kimberly Curry is a 32 y.o. female who had a laparoscopic cholecystectomy  on 04/03/12 by Dr. Gerrit Friends  returns to the clinic today.  Pathology reveals chronic cholecystitis and cholelithiasis with one benign lymph node, no tumor noted.  The patient is tolerating their diet well and is having no severe pain.  Bowel function is good.  The pre-operative symptoms of abdominal pain, nausea, and vomiting have resolved.  No problems with the wounds.  Pt is returning to normal activity.  She has not returned to work yet.  She works at [employer] in [employer] and often [employer].     Objective: Vital signs in last 24 hours: Reviewed   PE: General:  Alert, NAD, pleasant Abdomen:  soft, NT/ND, +bs, incisions appear well-healed with no sign of infection or bleeding   Assessment/Plan  1.  S/P Laparoscopic Cholecystectomy: doing well, may resume regular activity.  She can return to work on Monday light duty (no lifting/pushing/pulling >40lbs until 6 weeks post op).  After 6 weeks no restrictions (full duty).  Pt will follow up with [place] PRN and knows to call with questions or concerns.      Aris Georgia, PA-C 04/21/2012

## 2012-11-19 ENCOUNTER — Other Ambulatory Visit: Payer: Self-pay

## 2012-11-24 ENCOUNTER — Emergency Department (HOSPITAL_BASED_OUTPATIENT_CLINIC_OR_DEPARTMENT_OTHER)
Admission: EM | Admit: 2012-11-24 | Discharge: 2012-11-24 | Disposition: A | Discharge status: Home / Self Care | Payer: No Typology Code available for payment source | Attending: Emergency Medicine | Admitting: Emergency Medicine

## 2012-11-24 ENCOUNTER — Encounter (HOSPITAL_BASED_OUTPATIENT_CLINIC_OR_DEPARTMENT_OTHER): Payer: Self-pay | Admitting: Emergency Medicine

## 2012-11-24 ENCOUNTER — Emergency Department (HOSPITAL_BASED_OUTPATIENT_CLINIC_OR_DEPARTMENT_OTHER): Payer: No Typology Code available for payment source

## 2012-11-24 DIAGNOSIS — S335XXA Sprain of ligaments of lumbar spine, initial encounter: Secondary | ICD-10-CM | POA: Insufficient documentation

## 2012-11-24 DIAGNOSIS — S39012A Strain of muscle, fascia and tendon of lower back, initial encounter: Secondary | ICD-10-CM

## 2012-11-24 DIAGNOSIS — Y9389 Activity, other specified: Secondary | ICD-10-CM | POA: Insufficient documentation

## 2012-11-24 DIAGNOSIS — Y92410 Unspecified street and highway as the place of occurrence of the external cause: Secondary | ICD-10-CM | POA: Insufficient documentation

## 2012-11-24 MED ORDER — IBUPROFEN 400 MG PO TABS
600.0000 mg | ORAL_TABLET | Freq: Once | ORAL | Status: AC
  Administered 2012-11-24: 600 mg via ORAL
  Filled 2012-11-24 (×2): qty 1

## 2012-11-24 MED ORDER — HYDROCODONE-ACETAMINOPHEN 5-325 MG PO TABS: 1.0000 | ORAL_TABLET | ORAL | Status: AC | PRN

## 2012-11-24 MED ORDER — NAPROXEN 500 MG PO TABS: 500.0000 mg | ORAL_TABLET | Freq: Two times a day (BID) | ORAL | Status: AC

## 2012-11-24 MED ORDER — CYCLOBENZAPRINE HCL 5 MG PO TABS: 5.0000 mg | ORAL_TABLET | Freq: Three times a day (TID) | ORAL | Status: AC | PRN
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
## 2012-11-24 NOTE — ED Notes (Signed)
Patient transported to X-ray 
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
## 2013-01-26 ENCOUNTER — Ambulatory Visit: Payer: No Typology Code available for payment source | Attending: Specialist | Admitting: Rehabilitation

## 2013-01-26 DIAGNOSIS — M545 Low back pain, unspecified: Secondary | ICD-10-CM | POA: Insufficient documentation

## 2013-01-26 DIAGNOSIS — IMO0001 Reserved for inherently not codable concepts without codable children: Secondary | ICD-10-CM | POA: Insufficient documentation

## 2013-01-29 ENCOUNTER — Ambulatory Visit: Payer: No Typology Code available for payment source | Admitting: Rehabilitation

## 2013-01-29 DIAGNOSIS — IMO0001 Reserved for inherently not codable concepts without codable children: Secondary | ICD-10-CM | POA: Diagnosis not present

## 2013-02-02 ENCOUNTER — Ambulatory Visit: Payer: No Typology Code available for payment source | Admitting: Rehabilitation

## 2013-02-02 DIAGNOSIS — IMO0001 Reserved for inherently not codable concepts without codable children: Secondary | ICD-10-CM | POA: Diagnosis not present

## 2013-02-04 ENCOUNTER — Ambulatory Visit: Payer: No Typology Code available for payment source | Admitting: Rehabilitation

## 2013-02-04 DIAGNOSIS — IMO0001 Reserved for inherently not codable concepts without codable children: Secondary | ICD-10-CM | POA: Diagnosis not present

## 2013-02-09 ENCOUNTER — Ambulatory Visit: Payer: No Typology Code available for payment source | Admitting: Rehabilitation

## 2013-02-09 DIAGNOSIS — IMO0001 Reserved for inherently not codable concepts without codable children: Secondary | ICD-10-CM | POA: Diagnosis not present

## 2013-02-12 ENCOUNTER — Ambulatory Visit: Payer: No Typology Code available for payment source | Admitting: Rehabilitation

## 2013-02-12 DIAGNOSIS — IMO0001 Reserved for inherently not codable concepts without codable children: Secondary | ICD-10-CM | POA: Diagnosis not present

## 2013-02-16 ENCOUNTER — Ambulatory Visit: Payer: No Typology Code available for payment source | Attending: Specialist | Admitting: Rehabilitation

## 2013-02-16 DIAGNOSIS — M545 Low back pain, unspecified: Secondary | ICD-10-CM | POA: Insufficient documentation

## 2013-02-16 DIAGNOSIS — IMO0001 Reserved for inherently not codable concepts without codable children: Secondary | ICD-10-CM | POA: Diagnosis present

## 2013-02-18 ENCOUNTER — Ambulatory Visit: Payer: No Typology Code available for payment source | Admitting: Rehabilitation

## 2013-02-18 DIAGNOSIS — IMO0001 Reserved for inherently not codable concepts without codable children: Secondary | ICD-10-CM | POA: Diagnosis not present

## 2013-02-23 ENCOUNTER — Ambulatory Visit: Payer: No Typology Code available for payment source | Admitting: Rehabilitation

## 2013-02-23 DIAGNOSIS — IMO0001 Reserved for inherently not codable concepts without codable children: Secondary | ICD-10-CM | POA: Diagnosis not present

## 2013-02-25 ENCOUNTER — Ambulatory Visit: Payer: No Typology Code available for payment source | Admitting: Rehabilitation

## 2013-02-25 DIAGNOSIS — IMO0001 Reserved for inherently not codable concepts without codable children: Secondary | ICD-10-CM | POA: Diagnosis not present

## 2013-03-10 ENCOUNTER — Ambulatory Visit: Payer: No Typology Code available for payment source | Admitting: Rehabilitation

## 2013-08-01 ENCOUNTER — Encounter (HOSPITAL_BASED_OUTPATIENT_CLINIC_OR_DEPARTMENT_OTHER): Payer: Self-pay | Admitting: Emergency Medicine

## 2013-08-01 ENCOUNTER — Emergency Department (HOSPITAL_BASED_OUTPATIENT_CLINIC_OR_DEPARTMENT_OTHER)
Admission: EM | Admit: 2013-08-01 | Discharge: 2013-08-01 | Disposition: A | Discharge status: Home / Self Care | Payer: No Typology Code available for payment source | Attending: Emergency Medicine | Admitting: Emergency Medicine

## 2013-08-01 DIAGNOSIS — Z3202 Encounter for pregnancy test, result negative: Secondary | ICD-10-CM | POA: Insufficient documentation

## 2013-08-01 DIAGNOSIS — F1210 Cannabis abuse, uncomplicated: Secondary | ICD-10-CM | POA: Insufficient documentation

## 2013-08-01 DIAGNOSIS — R1115 Cyclical vomiting syndrome unrelated to migraine: Secondary | ICD-10-CM | POA: Insufficient documentation

## 2013-08-01 DIAGNOSIS — Z791 Long term (current) use of non-steroidal anti-inflammatories (NSAID): Secondary | ICD-10-CM | POA: Insufficient documentation

## 2013-08-01 DIAGNOSIS — R110 Nausea: Secondary | ICD-10-CM | POA: Insufficient documentation

## 2013-08-01 DIAGNOSIS — Z79899 Other long term (current) drug therapy: Secondary | ICD-10-CM | POA: Insufficient documentation

## 2013-08-01 LAB — CBC WITH DIFFERENTIAL/PLATELET
BASOS ABS: 0 10*3/uL (ref 0.0–0.1)
Basophils Relative: 0 % (ref 0–1)
EOS PCT: 0 % (ref 0–5)
Eosinophils Absolute: 0 10*3/uL (ref 0.0–0.7)
HEMATOCRIT: 37.3 % (ref 36.0–46.0)
HEMOGLOBIN: 12.6 g/dL (ref 12.0–15.0)
Lymphocytes Relative: 14 % (ref 12–46)
Lymphs Abs: 1.2 10*3/uL (ref 0.7–4.0)
MCH: 29.5 pg (ref 26.0–34.0)
MCHC: 33.8 g/dL (ref 30.0–36.0)
MCV: 87.4 fL (ref 78.0–100.0)
MONOS PCT: 4 % (ref 3–12)
Monocytes Absolute: 0.4 10*3/uL (ref 0.1–1.0)
Neutro Abs: 6.9 10*3/uL (ref 1.7–7.7)
Neutrophils Relative %: 82 % — ABNORMAL HIGH (ref 43–77)
Platelets: 265 10*3/uL (ref 150–400)
RBC: 4.27 MIL/uL (ref 3.87–5.11)
RDW: 12.8 % (ref 11.5–15.5)
WBC: 8.4 10*3/uL (ref 4.0–10.5)

## 2013-08-01 LAB — URINALYSIS, ROUTINE W REFLEX MICROSCOPIC
Bilirubin Urine: NEGATIVE
GLUCOSE, UA: NEGATIVE mg/dL
Hgb urine dipstick: NEGATIVE
KETONES UR: 15 mg/dL — AB
LEUKOCYTES UA: NEGATIVE
NITRITE: NEGATIVE
PROTEIN: 100 mg/dL — AB
Specific Gravity, Urine: 1.024 (ref 1.005–1.030)
UROBILINOGEN UA: 1 mg/dL (ref 0.0–1.0)
pH: 8.5 — ABNORMAL HIGH (ref 5.0–8.0)

## 2013-08-01 LAB — URINE MICROSCOPIC-ADD ON

## 2013-08-01 LAB — RAPID URINE DRUG SCREEN, HOSP PERFORMED
Amphetamines: NOT DETECTED
BARBITURATES: NOT DETECTED
Benzodiazepines: NOT DETECTED
Cocaine: NOT DETECTED
Opiates: NOT DETECTED
Tetrahydrocannabinol: POSITIVE — AB

## 2013-08-01 LAB — COMPREHENSIVE METABOLIC PANEL
ALT: 12 U/L (ref 0–35)
ANION GAP: 16 — AB (ref 5–15)
AST: 19 U/L (ref 0–37)
Albumin: 4.4 g/dL (ref 3.5–5.2)
Alkaline Phosphatase: 94 U/L (ref 39–117)
BUN: 6 mg/dL (ref 6–23)
CALCIUM: 9.5 mg/dL (ref 8.4–10.5)
CO2: 25 meq/L (ref 19–32)
Chloride: 101 mEq/L (ref 96–112)
Creatinine, Ser: 0.7 mg/dL (ref 0.50–1.10)
Glucose, Bld: 135 mg/dL — ABNORMAL HIGH (ref 70–99)
Potassium: 3.6 mEq/L — ABNORMAL LOW (ref 3.7–5.3)
Sodium: 142 mEq/L (ref 137–147)
Total Bilirubin: 0.5 mg/dL (ref 0.3–1.2)
Total Protein: 8.3 g/dL (ref 6.0–8.3)

## 2013-08-01 LAB — PREGNANCY, URINE: Preg Test, Ur: NEGATIVE

## 2013-08-01 LAB — LIPASE, BLOOD: LIPASE: 57 U/L (ref 11–59)

## 2013-08-01 MED ORDER — ONDANSETRON HCL 4 MG/2ML IJ SOLN
INTRAMUSCULAR | Status: AC
  Administered 2013-08-01: 4 mg via INTRAVENOUS
  Filled 2013-08-01: qty 2

## 2013-08-01 MED ORDER — METOCLOPRAMIDE HCL 10 MG PO TABS: 10.0000 mg | ORAL_TABLET | Freq: Four times a day (QID) | ORAL | Status: AC

## 2013-08-01 MED ORDER — METOCLOPRAMIDE HCL 5 MG/ML IJ SOLN
10.0000 mg | Freq: Once | INTRAMUSCULAR | Status: AC
  Administered 2013-08-01: 10 mg via INTRAVENOUS
  Filled 2013-08-01: qty 2

## 2013-08-01 MED ORDER — ONDANSETRON HCL 4 MG/2ML IJ SOLN
4.0000 mg | Freq: Once | INTRAMUSCULAR | Status: AC
  Administered 2013-08-01: 4 mg via INTRAVENOUS

## 2013-08-01 MED ORDER — FENTANYL CITRATE 0.05 MG/ML IJ SOLN
50.0000 ug | Freq: Once | INTRAMUSCULAR | Status: AC
  Administered 2013-08-01: 50 ug via INTRAVENOUS
  Filled 2013-08-01: qty 2

## 2013-08-01 NOTE — Discharge Instructions (Signed)

## 2013-08-01 NOTE — ED Provider Notes (Signed)
CSN: [id number]     Arrival date & time 08/01/13  4098 History   First MD Initiated Contact with Patient 08/01/13 1025     Chief Complaint  Patient presents with  . Emesis      HPI Patient presents with several day history of nausea and vomiting.  Patient denies hematemesis or melena.  Patient denies hematochezia.  No accompanying diarrhea or constipation.  Patient denies fever.  Saw physician about a week [place] and he attributed it to possible food poisoning.  She continues to be symptomatic.  Patient has had a long history of stomach issues and had a cholecystectomy along with a negative EGD.  Patient has some abdominal pain from the protective vomiting.  She did vomit while in the emergency room and it was bile-colored emesis.     History reviewed. No pertinent past medical history. Past Surgical History  Procedure Laterality Date  . Cholecystectomy N/A 04/03/2012    Procedure: LAPAROSCOPIC CHOLECYSTECTOMY WITH INTRAOPERATIVE CHOLANGIOGRAM;  Surgeon: Velora Heckler, MD;  Location: [facility];  Service: General;  Laterality: N/A;   EGD Family History  Problem Relation Age of Onset  . Hypertension Mother   . Hypertension Father   . Diabetes Father   . Sickle cell trait Brother    History  Substance Use Topics  . Smoking status: Never Smoker   . Smokeless tobacco: Never Used  . Alcohol Use: Yes     Comment: occasional   OB History   Grav Para Term Preterm Abortions TAB SAB Ect Mult Living                 Review of Systems  All other systems reviewed and are negative  Allergies  Celery oil  Home Medications   Prior to Admission medications   Medication Sig Start Date End Date Taking? Authorizing Provider  cyclobenzaprine (FLEXERIL) 5 MG tablet Take 1 tablet (5 mg total) by mouth 3 (three) times daily as needed for muscle spasms. 11/24/12   Linwood Dibbles, MD  HYDROcodone-acetaminophen (NORCO/VICODIN) 5-325 MG per tablet Take 1-2 tablets  by mouth every 4 (four) hours as needed for severe pain. 11/24/12   Linwood Dibbles, MD  metoCLOPramide (REGLAN) 10 MG tablet Take 1 tablet (10 mg total) by mouth every 6 (six) hours. 08/01/13   Nelia Shi, MD  naproxen (NAPROSYN) 500 MG tablet Take 1 tablet (500 mg total) by mouth 2 (two) times daily. 11/24/12   Linwood Dibbles, MD   BP 104/61  Pulse 60  Temp(Src) 99.1 F (37.3 C) (Oral)  Resp 16  Wt 168 lb (76.204 kg)  SpO2 100% Physical Exam Physical Exam  Nursing note and vitals reviewed. Constitutional: She is oriented to person, place, and time. She appears well-developed and well-nourished.  Patient appears uncomfortable from nausea. HENT:  Head: Normocephalic and atraumatic.  Eyes: Pupils are equal, round, and reactive to light.  Neck: Normal range of motion.  Cardiovascular: Normal rate and intact distal pulses.   Pulmonary/Chest: No respiratory distress.  Abdominal: Normal appearance. She exhibits no distension.  no rebound or guarding tenderness to palpation.  Active bowel sounds.  No palpable liver or spleen. Musculoskeletal: Normal range of motion.  Neurological: She is alert and oriented to person, place, and time. No cranial nerve deficit.  Skin: Skin is warm and dry. No rash noted.  Psychiatric: She has a normal mood and affect. Her behavior is normal.   ED Course  Procedures (  including critical care time) Labs Review Labs Reviewed  CBC WITH DIFFERENTIAL - Abnormal; Notable for the following:    Neutrophils Relative % 82 (*)    All other components within normal limits  COMPREHENSIVE METABOLIC PANEL - Abnormal; Notable for the following:    Potassium 3.6 (*)    Glucose, Bld 135 (*)    Anion gap 16 (*)    All other components within normal limits  URINALYSIS, ROUTINE W REFLEX MICROSCOPIC - Abnormal; Notable for the following:    pH 8.5 (*)    Ketones, ur 15 (*)    Protein, ur 100 (*)    All other components within normal limits  URINE RAPID DRUG SCREEN (HOSP  PERFORMED) - Abnormal; Notable for the following:    Tetrahydrocannabinol POSITIVE (*)    All other components within normal limits  URINE MICROSCOPIC-ADD ON - Abnormal; Notable for the following:    Squamous Epithelial / LPF FEW (*)    Bacteria, UA MANY (*)    All other components within normal limits  URINE CULTURE  PREGNANCY, URINE  LIPASE, BLOOD    Imaging Review No results found.   I discussed with the patient that the nausea and vomiting that she is experiencing may be secondary to her cannabis use. She did admit that she does smoke cannabis and she said she would stop and see if it helped her nausea and vomiting.   MDM   Final diagnoses:  Intractable cyclical vomiting with nausea  Cannabis abuse        Nelia Shiobert L Pharrell Ledford, MD 08/01/13 1304

## 2013-08-01 NOTE — ED Notes (Addendum)
Ice chips given per patient request. 

## 2013-08-01 NOTE — ED Notes (Signed)
Patient states that she has had N/V for a few weeks. States that it had began to improve but last night she ate something and began to start vomiting again.

## 2013-08-03 LAB — URINE CULTURE: Colony Count: 8000

## 2013-08-04 ENCOUNTER — Emergency Department (HOSPITAL_BASED_OUTPATIENT_CLINIC_OR_DEPARTMENT_OTHER)
Admission: EM | Admit: 2013-08-04 | Discharge: 2013-08-04 | Disposition: A | Discharge status: Home / Self Care | Payer: No Typology Code available for payment source | Attending: Emergency Medicine | Admitting: Emergency Medicine

## 2013-08-04 ENCOUNTER — Encounter (HOSPITAL_BASED_OUTPATIENT_CLINIC_OR_DEPARTMENT_OTHER): Payer: Self-pay | Admitting: Emergency Medicine

## 2013-08-04 ENCOUNTER — Emergency Department (HOSPITAL_BASED_OUTPATIENT_CLINIC_OR_DEPARTMENT_OTHER): Payer: No Typology Code available for payment source

## 2013-08-04 DIAGNOSIS — Z791 Long term (current) use of non-steroidal anti-inflammatories (NSAID): Secondary | ICD-10-CM | POA: Insufficient documentation

## 2013-08-04 DIAGNOSIS — R1110 Vomiting, unspecified: Secondary | ICD-10-CM

## 2013-08-04 DIAGNOSIS — Z9089 Acquired absence of other organs: Secondary | ICD-10-CM | POA: Insufficient documentation

## 2013-08-04 DIAGNOSIS — F1210 Cannabis abuse, uncomplicated: Secondary | ICD-10-CM | POA: Insufficient documentation

## 2013-08-04 DIAGNOSIS — R1033 Periumbilical pain: Secondary | ICD-10-CM | POA: Insufficient documentation

## 2013-08-04 DIAGNOSIS — R112 Nausea with vomiting, unspecified: Secondary | ICD-10-CM | POA: Insufficient documentation

## 2013-08-04 LAB — COMPREHENSIVE METABOLIC PANEL
ALT: 7 U/L (ref 0–35)
AST: 10 U/L (ref 0–37)
Albumin: 4 g/dL (ref 3.5–5.2)
Alkaline Phosphatase: 81 U/L (ref 39–117)
Anion gap: 15 (ref 5–15)
BILIRUBIN TOTAL: 0.7 mg/dL (ref 0.3–1.2)
BUN: 6 mg/dL (ref 6–23)
CHLORIDE: 93 meq/L — AB (ref 96–112)
CO2: 28 meq/L (ref 19–32)
CREATININE: 0.7 mg/dL (ref 0.50–1.10)
Calcium: 9.1 mg/dL (ref 8.4–10.5)
GFR calc Af Amer: >90 mL/min (ref >90)
Glucose, Bld: 112 mg/dL — ABNORMAL HIGH (ref 70–99)
POTASSIUM: 2.9 meq/L — AB (ref 3.7–5.3)
Sodium: 136 mEq/L — ABNORMAL LOW (ref 137–147)
Total Protein: 7.6 g/dL (ref 6.0–8.3)

## 2013-08-04 LAB — CBC WITH DIFFERENTIAL/PLATELET
BASOS ABS: 0 10*3/uL (ref 0.0–0.1)
BASOS PCT: 0 % (ref 0–1)
Eosinophils Absolute: 0 10*3/uL (ref 0.0–0.7)
Eosinophils Relative: 0 % (ref 0–5)
HCT: 36.5 % (ref 36.0–46.0)
HEMOGLOBIN: 12.4 g/dL (ref 12.0–15.0)
LYMPHS PCT: 23 % (ref 12–46)
Lymphs Abs: 2 10*3/uL (ref 0.7–4.0)
MCH: 29.2 pg (ref 26.0–34.0)
MCHC: 34 g/dL (ref 30.0–36.0)
MCV: 86.1 fL (ref 78.0–100.0)
MONO ABS: 0.8 10*3/uL (ref 0.1–1.0)
MONOS PCT: 9 % (ref 3–12)
NEUTROS ABS: 6.1 10*3/uL (ref 1.7–7.7)
NEUTROS PCT: 68 % (ref 43–77)
Platelets: 287 10*3/uL (ref 150–400)
RBC: 4.24 MIL/uL (ref 3.87–5.11)
RDW: 12.4 % (ref 11.5–15.5)
WBC: 9 10*3/uL (ref 4.0–10.5)

## 2013-08-04 LAB — LIPASE, BLOOD: Lipase: 19 U/L (ref 11–59)

## 2013-08-04 MED ORDER — KETOROLAC TROMETHAMINE 30 MG/ML IJ SOLN
30.0000 mg | Freq: Once | INTRAMUSCULAR | Status: AC
  Administered 2013-08-04: 30 mg via INTRAVENOUS
  Filled 2013-08-04: qty 1

## 2013-08-04 MED ORDER — MORPHINE SULFATE 4 MG/ML IJ SOLN
4.0000 mg | Freq: Once | INTRAMUSCULAR | Status: AC
  Administered 2013-08-04: 4 mg via INTRAVENOUS
  Filled 2013-08-04: qty 1

## 2013-08-04 MED ORDER — POTASSIUM CHLORIDE CRYS ER 20 MEQ PO TBCR
40.0000 meq | EXTENDED_RELEASE_TABLET | Freq: Once | ORAL | Status: AC
  Administered 2013-08-04: 40 meq via ORAL
  Filled 2013-08-04: qty 2

## 2013-08-04 MED ORDER — PROMETHAZINE HCL 25 MG/ML IJ SOLN
12.5000 mg | Freq: Once | INTRAMUSCULAR | Status: AC
  Administered 2013-08-04: 12.5 mg via INTRAVENOUS
  Filled 2013-08-04: qty 1

## 2013-08-04 MED ORDER — ONDANSETRON HCL 4 MG/2ML IJ SOLN
4.0000 mg | Freq: Once | INTRAMUSCULAR | Status: AC
  Administered 2013-08-04: 4 mg via INTRAVENOUS
  Filled 2013-08-04: qty 2

## 2013-08-04 MED ORDER — SODIUM CHLORIDE 0.9 % IV BOLUS (SEPSIS)
1000.0000 mL | Freq: Once | INTRAVENOUS | Status: AC
  Administered 2013-08-04: 1000 mL via INTRAVENOUS

## 2013-08-04 MED ORDER — IOHEXOL 300 MG/ML  SOLN
100.0000 mL | Freq: Once | INTRAMUSCULAR | Status: AC | PRN
  Administered 2013-08-04: 100 mL via INTRAVENOUS

## 2013-08-04 MED ORDER — METOCLOPRAMIDE HCL 5 MG/ML IJ SOLN
10.0000 mg | Freq: Once | INTRAMUSCULAR | Status: AC
  Administered 2013-08-04: 10 mg via INTRAVENOUS
  Filled 2013-08-04: qty 2

## 2013-08-04 MED ORDER — PROMETHAZINE HCL 25 MG PO TABS: 25.0000 mg | ORAL_TABLET | Freq: Four times a day (QID) | ORAL | Status: AC | PRN

## 2013-08-04 NOTE — ED Notes (Signed)
MD at bedside. 

## 2013-08-04 NOTE — ED Provider Notes (Signed)
CSN: [id number]     Arrival date & time 08/04/13  1103 History   First MD Initiated Contact with Patient 08/04/13 1114     Chief Complaint  Patient presents with  . Emesis     (Consider location/radiation/quality/duration/timing/severity/associated sxs/prior Treatment) HPI Comments: Patient is a 33 year old female with past medical history of cholecystectomy. She presents today with complaints of abdominal cramping, nausea, and vomiting she says is been ongoing for the past 10 days. Her symptoms started while she was at [facility] on [place]. She was seen in the ER there. When she returned home she was seen in the ER here and again by her GI doctor, however she is not feeling any better. She reports severe abdominal cramping and states she's been unable to keep anything down.  Patient is a 33 y.o. female presenting with vomiting. The history is provided by the patient.  Emesis Severity:  Severe Duration:  10 days Timing:  Constant Quality:  Stomach contents Progression:  Worsening Chronicity:  New Recent urination:  Decreased Relieved by:  Nothing Worsened by:  Nothing tried Ineffective treatments:  None tried Associated symptoms: abdominal pain   Associated symptoms: no chills, no diarrhea and no fever     History reviewed. No pertinent past medical history. Past Surgical History  Procedure Laterality Date  . Cholecystectomy N/A 04/03/2012    Procedure: LAPAROSCOPIC CHOLECYSTECTOMY WITH INTRAOPERATIVE CHOLANGIOGRAM;  Surgeon: Velora Hecklerodd M Gerkin, MD;  Location: [facility];  Service: General;  Laterality: N/A;   Family History  Problem Relation Age of Onset  . Hypertension Mother   . Hypertension Father   . Diabetes Father   . Sickle cell trait Brother    History  Substance Use Topics  . Smoking status: Never Smoker   . Smokeless tobacco: Never Used  . Alcohol Use: Yes     Comment: occasional   OB History   Grav Para Term Preterm Abortions TAB SAB Ect Mult Living       Review of Systems  Constitutional: Negative for chills.  Gastrointestinal: Positive for vomiting and abdominal pain. Negative for diarrhea.  All other systems reviewed and are negative.     Allergies  Celery oil  Home Medications   Prior to Admission medications   Medication Sig Start Date End Date Taking? Authorizing Provider  promethazine (PHENERGAN) 25 MG suppository Place 25 mg rectally every 6 (six) hours as needed for nausea or vomiting.   Yes Historical Provider, MD  cyclobenzaprine (FLEXERIL) 5 MG tablet Take 1 tablet (5 mg total) by mouth 3 (three) times daily as needed for muscle spasms. 11/24/12   Linwood DibblesJon Knapp, MD  HYDROcodone-acetaminophen (NORCO/VICODIN) 5-325 MG per tablet Take 1-2 tablets by mouth every 4 (four) hours as needed for severe pain. 11/24/12   Linwood DibblesJon Knapp, MD  metoCLOPramide (REGLAN) 10 MG tablet Take 1 tablet (10 mg total) by mouth every 6 (six) hours. 08/01/13   Nelia Shiobert L Beaton, MD  naproxen (NAPROSYN) 500 MG tablet Take 1 tablet (500 mg total) by mouth 2 (two) times daily. 11/24/12   Linwood DibblesJon Knapp, MD   BP 161/86  Pulse 58  Temp(Src) 98.7 F (37.1 C) (Oral)  Resp 20  Wt 168 lb (76.204 kg)  SpO2 100%  LMP 07/22/2013 Physical Exam  Nursing note and vitals reviewed. Constitutional: She is oriented to person, place, and time. She appears well-developed and well-nourished. No distress.  HENT:  Head: Normocephalic and atraumatic.  Neck: Normal range of motion. Neck supple.  Cardiovascular: Normal rate and regular  rhythm.  Exam reveals no gallop and no friction rub.   No murmur heard. Pulmonary/Chest: Effort normal and breath sounds normal. No respiratory distress. She has no wheezes.  Abdominal: Soft. Bowel sounds are normal. She exhibits no distension. There is tenderness. There is no rebound and no guarding.  There is tenderness to palpation in the periumbilical region.  Musculoskeletal: Normal range of motion.  Neurological: She is alert and oriented  to person, place, and time.  Skin: Skin is warm and dry. She is not diaphoretic.    ED Course  Procedures (including critical care time) Labs Review Labs Reviewed  CBC WITH DIFFERENTIAL  COMPREHENSIVE METABOLIC PANEL  LIPASE, BLOOD  URINALYSIS, ROUTINE W REFLEX MICROSCOPIC  PREGNANCY, URINE    Imaging Review No results found.   EKG Interpretation None      MDM   Final diagnoses:  None    Patient presents here with complaints of intractable nausea and vomiting. She was seen here several days ago for similar complaints. Workup at that time was unremarkable. She returns with continued pain and vomiting. Workup today reveals a normal CT scan and unremarkable laboratory studies. Reviewing her previous note reveals that she tested positive for marijuana and admitted to her that she will use of this. She states she has not used it in several days.  I've spoken with Dr. Elnoria Howard from gastroenterology regarding this patient. He actually spoke to her on the phone while she was in the ER and strongly believes that her vomiting is related to her marijuana use. He feels as though she is stable for discharge. She will be given anti-medics and advised to refrain from further marijuana use.    Geoffery Lyons, MD 08/04/13 [phone]

## 2013-08-04 NOTE — ED Notes (Signed)
C/o n/v since July 11. Unable to eat. Seen here on Sunday for same. No diarrhea. C/o low mid abd pain.

## 2013-08-04 NOTE — ED Notes (Signed)
Pt spitting up sputum during assessment.

## 2013-08-04 NOTE — Discharge Instructions (Signed)
Phenergan as prescribed.  Followup with Dr. hung in the next week.

## 2014-08-19 ENCOUNTER — Encounter (HOSPITAL_BASED_OUTPATIENT_CLINIC_OR_DEPARTMENT_OTHER): Payer: Self-pay | Admitting: Emergency Medicine

## 2014-08-19 ENCOUNTER — Emergency Department (HOSPITAL_BASED_OUTPATIENT_CLINIC_OR_DEPARTMENT_OTHER)
Admission: EM | Admit: 2014-08-19 | Discharge: 2014-08-20 | Disposition: A | Discharge status: Home / Self Care | Payer: 59 | Attending: Emergency Medicine | Admitting: Emergency Medicine

## 2014-08-19 ENCOUNTER — Emergency Department (HOSPITAL_BASED_OUTPATIENT_CLINIC_OR_DEPARTMENT_OTHER): Payer: 59

## 2014-08-19 DIAGNOSIS — Y9289 Other specified places as the place of occurrence of the external cause: Secondary | ICD-10-CM | POA: Diagnosis not present

## 2014-08-19 DIAGNOSIS — S99911A Unspecified injury of right ankle, initial encounter: Secondary | ICD-10-CM | POA: Diagnosis present

## 2014-08-19 DIAGNOSIS — S93401A Sprain of unspecified ligament of right ankle, initial encounter: Secondary | ICD-10-CM | POA: Insufficient documentation

## 2014-08-19 DIAGNOSIS — Y998 Other external cause status: Secondary | ICD-10-CM | POA: Diagnosis not present

## 2014-08-19 DIAGNOSIS — S8392XA Sprain of unspecified site of left knee, initial encounter: Secondary | ICD-10-CM | POA: Insufficient documentation

## 2014-08-19 DIAGNOSIS — Z791 Long term (current) use of non-steroidal anti-inflammatories (NSAID): Secondary | ICD-10-CM | POA: Diagnosis not present

## 2014-08-19 DIAGNOSIS — W19XXXA Unspecified fall, initial encounter: Secondary | ICD-10-CM

## 2014-08-19 DIAGNOSIS — Y9389 Activity, other specified: Secondary | ICD-10-CM | POA: Insufficient documentation

## 2014-08-19 DIAGNOSIS — W109XXA Fall (on) (from) unspecified stairs and steps, initial encounter: Secondary | ICD-10-CM | POA: Insufficient documentation

## 2014-08-19 NOTE — ED Notes (Signed)
Pt fell down stairs while rushing to get to work 30 min ago.  Pain to right ankle and left knee.  Pt drove self to ED and is ambulatory with limp.

## 2014-08-20 NOTE — ED Provider Notes (Signed)
CSN: [id number]     Arrival date & time 08/19/14  2311 History   First MD Initiated Contact with Patient 08/20/14 0043     Chief Complaint  Patient presents with  . Fall     (Consider location/radiation/quality/duration/timing/severity/associated sxs/prior Treatment) HPI Comments: Patient is a 34 year old female who presents after a fall down several steps. She states she is getting ready for work and lost her balance. She injured her right ankle and left knee.  Patient is a 34 y.o. female presenting with fall. The history is provided by the patient.  Fall This is a new problem. The current episode started 1 to 2 hours ago. The problem occurs constantly. The symptoms are aggravated by walking. Nothing relieves the symptoms. She has tried nothing for the symptoms. The treatment provided no relief.    History reviewed. No pertinent past medical history. Past Surgical History  Procedure Laterality Date  . Cholecystectomy N/A 04/03/2012    Procedure: LAPAROSCOPIC CHOLECYSTECTOMY WITH INTRAOPERATIVE CHOLANGIOGRAM;  Surgeon: Velora Heckler, MD;  Location: [facility];  Service: General;  Laterality: N/A;   Family History  Problem Relation Age of Onset  . Hypertension Mother   . Hypertension Father   . Diabetes Father   . Sickle cell trait Brother    History  Substance Use Topics  . Smoking status: Never Smoker   . Smokeless tobacco: Never Used  . Alcohol Use: Yes     Comment: occasional   OB History    No data available     Review of Systems  All other systems reviewed and are negative.     Allergies  Celery oil  Home Medications   Prior to Admission medications   Medication Sig Start Date End Date Taking? Authorizing Provider  cyclobenzaprine (FLEXERIL) 5 MG tablet Take 1 tablet (5 mg total) by mouth 3 (three) times daily as needed for muscle spasms. 11/24/12   Linwood Dibbles, MD  HYDROcodone-acetaminophen (NORCO/VICODIN) 5-325 MG per tablet Take 1-2 tablets by mouth every 4  (four) hours as needed for severe pain. 11/24/12   Linwood Dibbles, MD  metoCLOPramide (REGLAN) 10 MG tablet Take 1 tablet (10 mg total) by mouth every 6 (six) hours. 08/01/13   Nelva Nay, MD  naproxen (NAPROSYN) 500 MG tablet Take 1 tablet (500 mg total) by mouth 2 (two) times daily. 11/24/12   Linwood Dibbles, MD  promethazine (PHENERGAN) 25 MG suppository Place 25 mg rectally every 6 (six) hours as needed for nausea or vomiting.    Historical Provider, MD  promethazine (PHENERGAN) 25 MG tablet Take 1 tablet (25 mg total) by mouth every 6 (six) hours as needed for nausea. 08/04/13   Geoffery Lyons, MD   BP 106/86 mmHg  Pulse 84  Temp(Src) 98.4 F (36.9 C) (Oral)  Resp 16  Ht 5\' 8"  (1.727 m)  Wt 175 lb (79.379 kg)  BMI 26.61 kg/m2  SpO2 100%  LMP 08/12/2014 (Exact Date) Physical Exam  Constitutional: She is oriented to person, place, and time. She appears well-developed and well-nourished. No distress.  HENT:  Head: Normocephalic and atraumatic.  Neck: Normal range of motion. Neck supple.  Musculoskeletal: Normal range of motion.  The right ankle appears grossly normal. There is mild tenderness over the soft tissues overlying the lateral malleolus and inferior to the lateral malleolus. She has good range of motion in the joint appears stable.  The left knee appears grossly normal. There is no effusion. She has good range of motion with no crepitus. Anterior  and posterior drawer tests are negative. There is no laxity with varus or valgus stress.  Neurological: She is alert and oriented to person, place, and time.  Skin: Skin is warm and dry. She is not diaphoretic.  Nursing note and vitals reviewed.   ED Course  Procedures (including critical care time) Labs Review Labs Reviewed - No data to display  Imaging Review No results found.   EKG Interpretation None      MDM   Final diagnoses:  None    X-rays are negative for fracture and physical examination reveals no evidence for  ligamentous instability. This appears to be contusion/sprain. She will be treated with an Ace bandage, ice, and when necessary return.    Geoffery Lyons, MD 08/20/14 [phone]

## 2014-08-20 NOTE — Discharge Instructions (Signed)
Wear Ace bandage for the next several days. Ice for 20 minutes every 2 hours while awake for the next 2 days.  Ibuprofen 600 mg every 6 hours as needed for pain.  Follow-up with your primary Dr. if not improving in the next week.   Ankle Sprain An ankle sprain is an injury to the strong, fibrous tissues (ligaments) that hold the bones of your ankle joint together.  CAUSES An ankle sprain is usually caused by a fall or by twisting your ankle. Ankle sprains most commonly occur when you step on the outer edge of your foot, and your ankle turns inward. People who participate in sports are more prone to these types of injuries.  SYMPTOMS   Pain in your ankle. The pain may be present at rest or only when you are trying to stand or walk.  Swelling.  Bruising. Bruising may develop immediately or within 1 to 2 days after your injury.  Difficulty standing or walking, particularly when turning corners or changing directions. DIAGNOSIS  Your caregiver will ask you details about your injury and perform a physical exam of your ankle to determine if you have an ankle sprain. During the physical exam, your caregiver will press on and apply pressure to specific areas of your foot and ankle. Your caregiver will try to move your ankle in certain ways. An X-ray exam may be done to be sure a bone was not broken or a ligament did not separate from one of the bones in your ankle (avulsion fracture).  TREATMENT  Certain types of braces can help stabilize your ankle. Your caregiver can make a recommendation for this. Your caregiver may recommend the use of medicine for pain. If your sprain is severe, your caregiver may refer you to a surgeon who helps to restore function to parts of your skeletal system (orthopedist) or a physical therapist. HOME CARE INSTRUCTIONS   Apply ice to your injury for 1-2 days or as directed by your caregiver. Applying ice helps to reduce inflammation and pain.  Put ice in a plastic  bag.  Place a towel between your skin and the bag.  Leave the ice on for 15-20 minutes at a time, every 2 hours while you are awake.  Only take over-the-counter or prescription medicines for pain, discomfort, or fever as directed by your caregiver.  Elevate your injured ankle above the level of your heart as much as possible for 2-3 days.  If your caregiver recommends crutches, use them as instructed. Gradually put weight on the affected ankle. Continue to use crutches or a cane until you can walk without feeling pain in your ankle.  If you have a plaster splint, wear the splint as directed by your caregiver. Do not rest it on anything harder than a pillow for the first 24 hours. Do not put weight on it. Do not get it wet. You may take it off to take a shower or bath.  You may have been given an elastic bandage to wear around your ankle to provide support. If the elastic bandage is too tight (you have numbness or tingling in your foot or your foot becomes cold and blue), adjust the bandage to make it comfortable.  If you have an air splint, you may blow more air into it or let air out to make it more comfortable. You may take your splint off at night and before taking a shower or bath. Wiggle your toes in the splint several times per day to  decrease swelling. SEEK MEDICAL CARE IF:   You have rapidly increasing bruising or swelling.  Your toes feel extremely cold or you lose feeling in your foot.  Your pain is not relieved with medicine. SEEK IMMEDIATE MEDICAL CARE IF:  Your toes are numb or blue.  You have severe pain that is increasing. MAKE SURE YOU:   Understand these instructions.  Will watch your condition.  Will get help right away if you are not doing well or get worse. Document Released: 12/31/2004 Document Revised: 09/25/2011 Document Reviewed: 01/12/2011 [facility] Patient Information 2015 [place], [place]. This information is not intended to replace advice given to you by  your health care provider. Make sure you discuss any questions you have with your health care provider.

## 2014-08-20 NOTE — ED Notes (Signed)
Patient transported to X-ray 

## 2019-03-02 ENCOUNTER — Other Ambulatory Visit: Payer: Self-pay | Admitting: Cardiology

## 2019-03-02 DIAGNOSIS — Z20822 Contact with and (suspected) exposure to covid-19: Secondary | ICD-10-CM

## 2019-03-03 LAB — NOVEL CORONAVIRUS, NAA: SARS-CoV-2, NAA: NOT DETECTED

## 2019-03-23 ENCOUNTER — Other Ambulatory Visit: Payer: Self-pay | Admitting: Family Medicine

## 2019-03-23 ENCOUNTER — Other Ambulatory Visit (HOSPITAL_COMMUNITY)
Admission: RE | Admit: 2019-03-23 | Discharge: 2019-03-23 | Disposition: A | Discharge status: Home / Self Care | Payer: 59 | Source: Ambulatory Visit | Attending: Family Medicine | Admitting: Family Medicine

## 2019-03-23 DIAGNOSIS — Z124 Encounter for screening for malignant neoplasm of cervix: Secondary | ICD-10-CM | POA: Diagnosis not present

## 2019-03-25 LAB — CYTOLOGY - PAP
Chlamydia: NEGATIVE
Comment: NEGATIVE
Comment: NEGATIVE
Comment: NORMAL
Diagnosis: NEGATIVE
High risk HPV: NEGATIVE
Neisseria Gonorrhea: NEGATIVE

## 2020-05-25 ENCOUNTER — Encounter: Payer: Self-pay | Admitting: Obstetrics and Gynecology

## 2020-05-25 ENCOUNTER — Other Ambulatory Visit (HOSPITAL_COMMUNITY)
Admission: RE | Admit: 2020-05-25 | Discharge: 2020-05-25 | Disposition: A | Discharge status: Home / Self Care | Payer: 59 | Source: Ambulatory Visit | Attending: Obstetrics and Gynecology | Admitting: Obstetrics and Gynecology

## 2020-05-25 ENCOUNTER — Other Ambulatory Visit: Payer: Self-pay

## 2020-05-25 ENCOUNTER — Ambulatory Visit (INDEPENDENT_AMBULATORY_CARE_PROVIDER_SITE_OTHER): Payer: 59 | Admitting: Obstetrics and Gynecology

## 2020-05-25 VITALS — BP 111/77 | HR 99 | Ht 68.0 in | Wt 167.0 lb

## 2020-05-25 DIAGNOSIS — N924 Excessive bleeding in the premenopausal period: Secondary | ICD-10-CM

## 2020-05-25 DIAGNOSIS — Z01411 Encounter for gynecological examination (general) (routine) with abnormal findings: Secondary | ICD-10-CM | POA: Insufficient documentation

## 2020-05-25 NOTE — Progress Notes (Signed)
  Subjective:     Kimberly Curry is a 40 y.o. female with LMP 04/22/20 and BMI 25 who is here for a comprehensive physical exam. The patient reports history of menorrhagia x 1 month. She reports a history of a monthly period lasting 4-5 days. This past month her bleeding lasted an additional 2 weeks. She describes the bleeding as spotting intermixed with heavier days. She is not currently sexually active and declines contraception. She denies pelvic pain or abnormal discharge. Patient is without any other complaints  History reviewed. No pertinent past medical history. Past Surgical History:  Procedure Laterality Date  . CHOLECYSTECTOMY N/A 04/03/2012   Procedure: LAPAROSCOPIC CHOLECYSTECTOMY WITH INTRAOPERATIVE CHOLANGIOGRAM;  Surgeon: Velora Heckler, MD;  Location: [facility];  Service: General;  Laterality: N/A;   Family History  Problem Relation Age of Onset  . Hypertension Mother   . Hypertension Father   . Diabetes Father   . Sickle cell trait Brother     Social History   Socioeconomic History  . Marital status: Single    Spouse name: Not on file  . Number of children: Not on file  . Years of education: Not on file  . Highest education level: Not on file  Occupational History  . Not on file  Tobacco Use  . Smoking status: Never Smoker  . Smokeless tobacco: Never Used  Substance and Sexual Activity  . Alcohol use: Yes    Comment: occasional  . Drug use: No  . Sexual activity: Not Currently    Birth control/protection: None  Other Topics Concern  . Not on file  Social History Narrative  . Not on file   Social Determinants of Health   Financial Resource Strain: Not on file  Food Insecurity: Not on file  Transportation Needs: Not on file  Physical Activity: Not on file  Stress: Not on file  Social Connections: Not on file  Intimate Partner Violence: Not on file   Health Maintenance  Topic Date Due  . COVID-19 Vaccine (1) Never done  . HIV Screening  Never done  .  MAMMOGRAM  Never done  . Hepatitis C Screening  Never done  . TETANUS/TDAP  Never done  . INFLUENZA VACCINE  08/14/2020  . PAP SMEAR-Modifier  03/23/2022  . HPV VACCINES  Aged Out       Review of Systems Pertinent items noted in HPI and remainder of comprehensive ROS otherwise negative.   Objective:  Blood pressure 111/77, pulse 99, height 5\' 8"  (1.727 m), weight 167 lb (75.8 kg), last menstrual period 04/22/2020.     GENERAL: Well-developed, well-nourished female in no acute distress.  HEENT: Normocephalic, atraumatic. Sclerae anicteric.  NECK: Supple. Normal thyroid.  LUNGS: Clear to auscultation bilaterally.  HEART: Regular rate and rhythm. BREASTS: Symmetric in size. No palpable masses or lymphadenopathy, skin changes, or nipple drainage. ABDOMEN: Soft, nontender, nondistended. No organomegaly. PELVIC: Normal external female genitalia. Vagina is pink and rugated.  Normal discharge. Normal appearing cervix. Uterus is normal in size.  No adnexal mass or tenderness. EXTREMITIES: No cyanosis, clubbing, or edema, 2+ distal pulses.    Assessment:    Healthy female exam.      Plan:    Screening mammogram ordered Pap smear collected STI screening per patient request Patient will be contacted with abnormal results Patient advised to keep a menstrual calendar. If persistent abnormal cycle per patient to contact office for ultrasound See After Visit Summary for Counseling Recommendations

## 2020-05-25 NOTE — Progress Notes (Signed)
New GYN patient presents for problem visit today.  *Pt had +COVID 2-3 wks ago unsure if Virus has anything to do w/ change in cycle. Was changing super tampon every 1hr and pad. LMP: 04/22/2020 usually last 4 days w/ heavy flow the second day. Pt states recent cycle in April lasted til 05/12/20.  Last pap:03/23/2019 WNL per pt  Mammogram: pt cannot recall when however not in last 2 yrs.

## 2020-05-26 ENCOUNTER — Other Ambulatory Visit (HOSPITAL_COMMUNITY)
Admission: RE | Admit: 2020-05-26 | Discharge: 2020-05-26 | Disposition: A | Discharge status: Home / Self Care | Payer: 59 | Source: Ambulatory Visit | Attending: Obstetrics and Gynecology | Admitting: Obstetrics and Gynecology

## 2020-05-26 DIAGNOSIS — Z01411 Encounter for gynecological examination (general) (routine) with abnormal findings: Secondary | ICD-10-CM | POA: Insufficient documentation

## 2020-05-26 LAB — CERVICOVAGINAL ANCILLARY ONLY
Chlamydia: NEGATIVE
Comment: NEGATIVE
Comment: NORMAL
Neisseria Gonorrhea: NEGATIVE

## 2020-05-26 NOTE — Addendum Note (Signed)
Addended by: Maretta Bees on: 05/26/2020 09:04 AM   Modules accepted: Orders

## 2020-05-29 ENCOUNTER — Other Ambulatory Visit (HOSPITAL_BASED_OUTPATIENT_CLINIC_OR_DEPARTMENT_OTHER): Payer: Self-pay | Admitting: Obstetrics and Gynecology

## 2020-05-29 ENCOUNTER — Other Ambulatory Visit: Payer: Self-pay

## 2020-05-29 ENCOUNTER — Encounter (HOSPITAL_BASED_OUTPATIENT_CLINIC_OR_DEPARTMENT_OTHER): Payer: Self-pay

## 2020-05-29 ENCOUNTER — Ambulatory Visit (HOSPITAL_BASED_OUTPATIENT_CLINIC_OR_DEPARTMENT_OTHER)
Admission: RE | Admit: 2020-05-29 | Discharge: 2020-05-29 | Disposition: A | Discharge status: Home / Self Care | Payer: 59 | Source: Ambulatory Visit | Attending: Obstetrics and Gynecology | Admitting: Obstetrics and Gynecology

## 2020-05-29 ENCOUNTER — Ambulatory Visit (HOSPITAL_BASED_OUTPATIENT_CLINIC_OR_DEPARTMENT_OTHER): Admission: RE | Admit: 2020-05-29 | Payer: 59 | Source: Ambulatory Visit | Admitting: Radiology

## 2020-05-29 DIAGNOSIS — Z1231 Encounter for screening mammogram for malignant neoplasm of breast: Secondary | ICD-10-CM | POA: Insufficient documentation

## 2020-05-30 LAB — CBC
Hematocrit: 34.9 % (ref 34.0–46.6)
Hemoglobin: 11.3 g/dL (ref 11.1–15.9)
MCH: 29.2 pg (ref 26.6–33.0)
MCHC: 32.4 g/dL (ref 31.5–35.7)
MCV: 90 fL (ref 79–97)
Platelets: 280 10*3/uL (ref 150–450)
RBC: 3.87 x10E6/uL (ref 3.77–5.28)
RDW: 12.5 % (ref 11.7–15.4)
WBC: 6.2 10*3/uL (ref 3.4–10.8)

## 2020-05-30 LAB — RPR+HBSAG+HCVAB+...
HIV Screen 4th Generation wRfx: NONREACTIVE
Hep C Virus Ab: <0.1 s/co ratio (ref 0.0–0.9)
Hepatitis B Surface Ag: NEGATIVE
RPR Ser Ql: NONREACTIVE

## 2020-05-30 LAB — HEMOGLOBIN A1C
Est. average glucose Bld gHb Est-mCnc: 91 mg/dL
Hgb A1c MFr Bld: 4.8 % (ref 4.8–5.6)

## 2020-05-30 LAB — COMPREHENSIVE METABOLIC PANEL
ALT: 6 IU/L (ref 0–32)
AST: 10 IU/L (ref 0–40)
Albumin/Globulin Ratio: 1.4 (ref 1.2–2.2)
Albumin: 4.3 g/dL (ref 3.8–4.8)
Alkaline Phosphatase: 76 IU/L (ref 44–121)
BUN/Creatinine Ratio: 9 (ref 9–23)
BUN: 9 mg/dL (ref 6–24)
Bilirubin Total: 0.4 mg/dL (ref 0.0–1.2)
CO2: 21 mmol/L (ref 20–29)
Calcium: 9.2 mg/dL (ref 8.7–10.2)
Chloride: 102 mmol/L (ref 96–106)
Creatinine, Ser: 0.97 mg/dL (ref 0.57–1.00)
Globulin, Total: 3 g/dL (ref 1.5–4.5)
Glucose: 99 mg/dL (ref 65–99)
Potassium: 3.8 mmol/L (ref 3.5–5.2)
Sodium: 139 mmol/L (ref 134–144)
Total Protein: 7.3 g/dL (ref 6.0–8.5)
eGFR: 75 mL/min/{1.73_m2} (ref >59)

## 2020-05-30 LAB — CYTOLOGY - PAP
Comment: NEGATIVE
Diagnosis: NEGATIVE
High risk HPV: NEGATIVE

## 2020-05-30 LAB — TSH: TSH: 0.389 u[IU]/mL — ABNORMAL LOW (ref 0.450–4.500)

## 2020-05-31 ENCOUNTER — Other Ambulatory Visit: Payer: Self-pay

## 2020-05-31 ENCOUNTER — Other Ambulatory Visit: Payer: 59

## 2020-05-31 DIAGNOSIS — Z01411 Encounter for gynecological examination (general) (routine) with abnormal findings: Secondary | ICD-10-CM

## 2020-05-31 NOTE — Addendum Note (Signed)
Addended by: Catalina Antigua on: 05/31/2020 08:08 AM   Modules accepted: Orders

## 2020-06-01 LAB — T4, FREE: Free T4: 1.13 ng/dL (ref 0.82–1.77)

## 2020-06-01 LAB — T3, FREE: T3, Free: 2.9 pg/mL (ref 2.0–4.4)

## 2020-08-29 ENCOUNTER — Other Ambulatory Visit: Payer: Self-pay

## 2020-08-29 ENCOUNTER — Ambulatory Visit (INDEPENDENT_AMBULATORY_CARE_PROVIDER_SITE_OTHER): Payer: 59 | Admitting: Obstetrics and Gynecology

## 2020-08-29 ENCOUNTER — Encounter: Payer: Self-pay | Admitting: Obstetrics and Gynecology

## 2020-08-29 VITALS — BP 121/84 | HR 81 | Ht 68.0 in | Wt 171.3 lb

## 2020-08-29 DIAGNOSIS — N939 Abnormal uterine and vaginal bleeding, unspecified: Secondary | ICD-10-CM

## 2020-08-29 MED ORDER — TRANEXAMIC ACID 650 MG PO TABS: 1300.0000 mg | ORAL_TABLET | Freq: Three times a day (TID) | ORAL | 2 refills | Status: AC

## 2020-08-29 NOTE — Progress Notes (Signed)
Reports cycles are "doubling up" again. No contraception. Not sexually active. PAP and exam 05/26/20

## 2020-08-29 NOTE — Progress Notes (Signed)
40 yo P0 presenting today for evaluation of abnormal vaginal bleeding. Patient reports abnormal period in May and June but return of prolonged vaginal bleeding in July. Patient reports that her periods typically last 4-5 days but when prolonged can last 10 days. Patient is without any other complaints.  No past medical history on file. Past Surgical History:  Procedure Laterality Date   CHOLECYSTECTOMY N/A 04/03/2012   Procedure: LAPAROSCOPIC CHOLECYSTECTOMY WITH INTRAOPERATIVE CHOLANGIOGRAM;  Surgeon: Velora Heckler, MD;  Location: [facility];  Service: General;  Laterality: N/A;   Family History  Problem Relation Age of Onset   Hypertension Mother    Hypertension Father    Diabetes Father    Sickle cell trait Brother    Social History   Tobacco Use   Smoking status: Never   Smokeless tobacco: Never  Vaping Use   Vaping Use: Never used  Substance Use Topics   Alcohol use: Yes    Comment: occasional   Drug use: No   ROS See pertinent in HPI. All other systems reviewed and non contributory  Blood pressure 121/84, pulse 81, height 5\' 8"  (1.727 m), weight 171 lb 4.8 oz (77.7 kg), last menstrual period 08/28/2020. GENERAL: Well-developed, well-nourished female in no acute distress.  NEURO: alert and oriented x 3   A/P 40 yo with  - pelvic ultrasound ordered - Discussed medical management vs surgical management pending ultrasound findings - Rx Lysteda provided to help with prolonged heavy period - Patient will be contacted with results

## 2020-09-06 ENCOUNTER — Other Ambulatory Visit: Payer: Self-pay

## 2020-09-06 ENCOUNTER — Ambulatory Visit (HOSPITAL_BASED_OUTPATIENT_CLINIC_OR_DEPARTMENT_OTHER)
Admission: RE | Admit: 2020-09-06 | Discharge: 2020-09-06 | Disposition: A | Discharge status: Home / Self Care | Payer: 59 | Source: Ambulatory Visit | Attending: Obstetrics and Gynecology | Admitting: Obstetrics and Gynecology

## 2020-09-06 DIAGNOSIS — N939 Abnormal uterine and vaginal bleeding, unspecified: Secondary | ICD-10-CM | POA: Diagnosis present

## 2021-01-23 DIAGNOSIS — Z13220 Encounter for screening for lipoid disorders: Secondary | ICD-10-CM | POA: Diagnosis not present

## 2022-01-31 DIAGNOSIS — Z0000 Encounter for general adult medical examination without abnormal findings: Secondary | ICD-10-CM | POA: Diagnosis not present

## 2022-01-31 DIAGNOSIS — Z13220 Encounter for screening for lipoid disorders: Secondary | ICD-10-CM | POA: Diagnosis not present

## 2022-03-26 IMAGING — US US PELVIS COMPLETE WITH TRANSVAGINAL
1 series · 13 of 25 positions shown · non-contrast
Comparison: None

CLINICAL DATA: Abnormal uterine bleeding, abnormal menses [REDACTED]
and Perween prolonged bleeding in [REDACTED], LMP 08/28/2020



[Series 1: us pelvic complete with transvaginal · 13 of 73 slices shown]
[im 1/73]
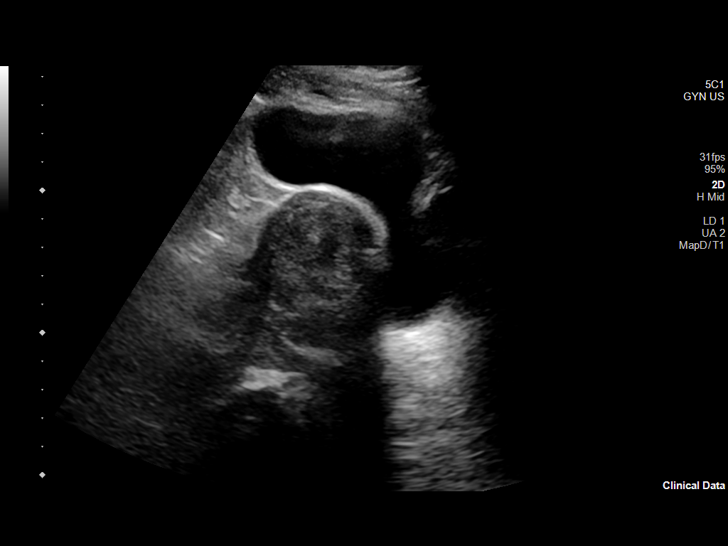
[im 7/73]
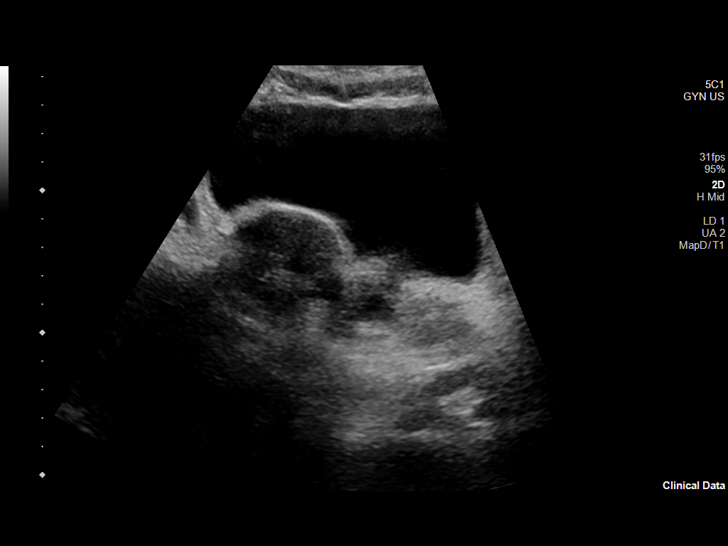
[im 13/73]
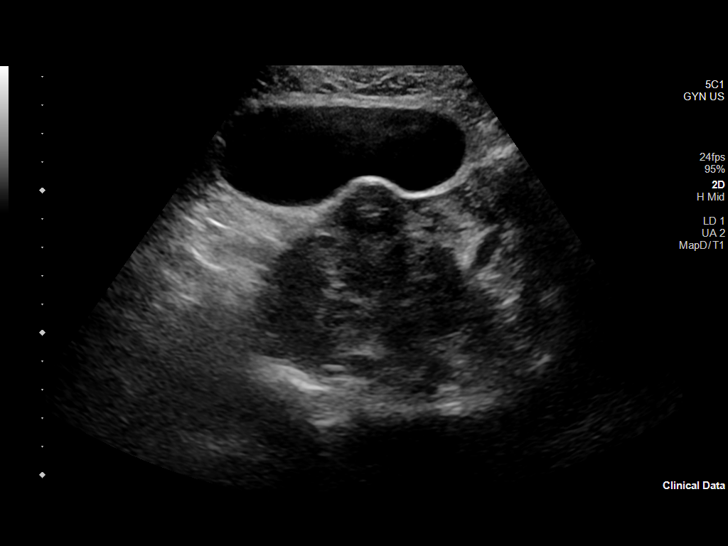
[im 19/73]
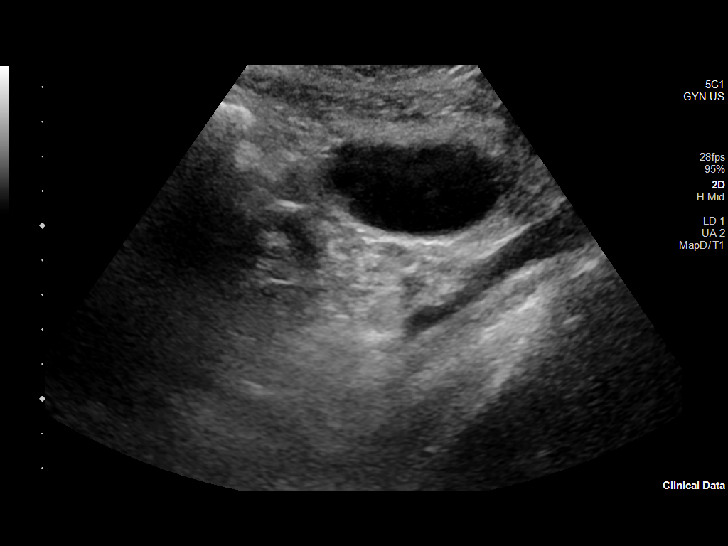
[im 25/73]
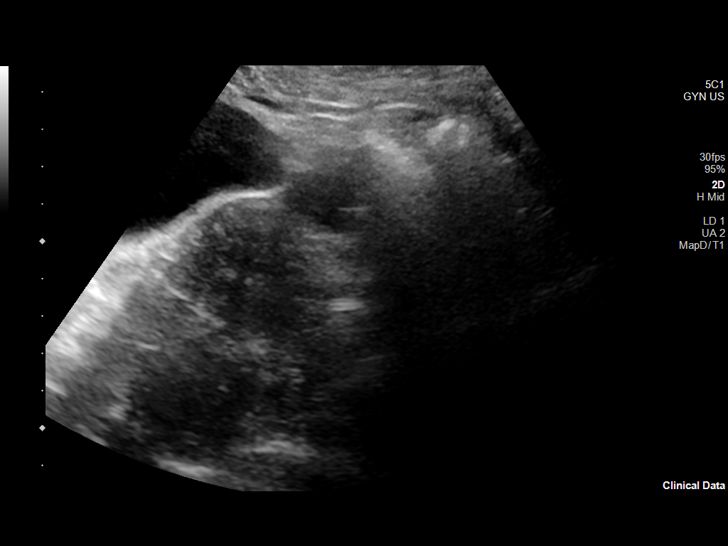
[im 31/73]
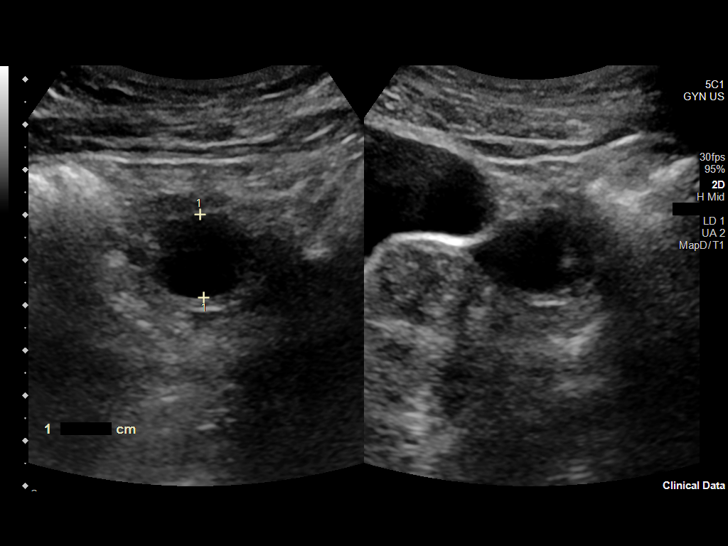
[im 37/73]
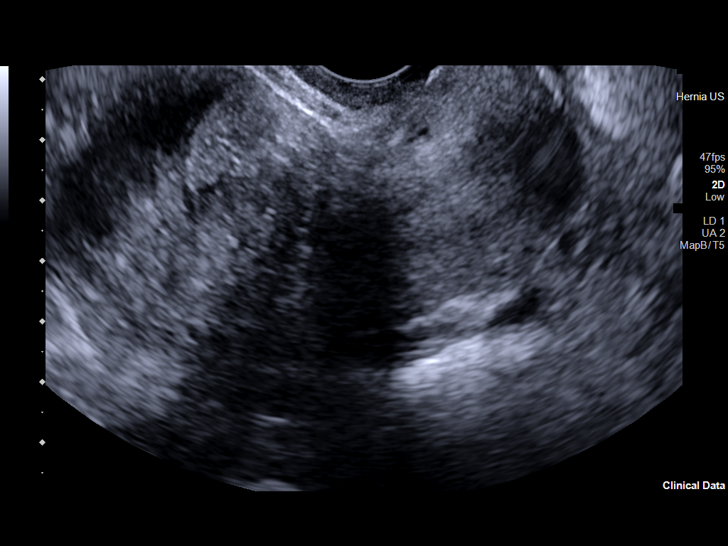
[im 43/73]
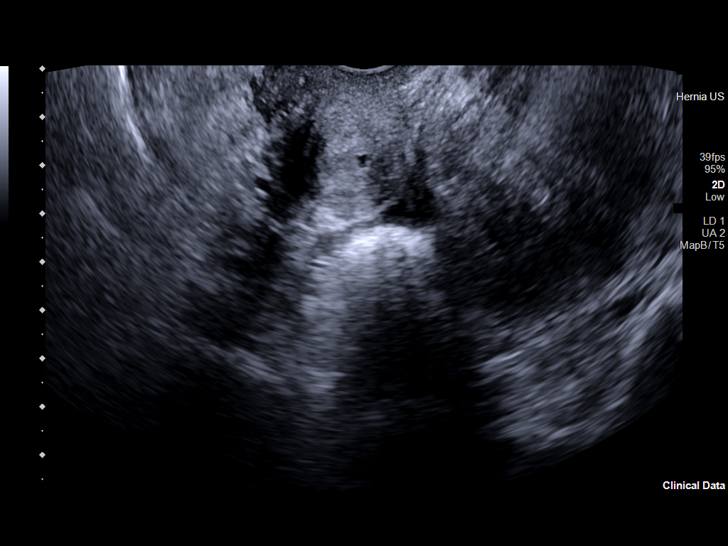
[im 49/73]
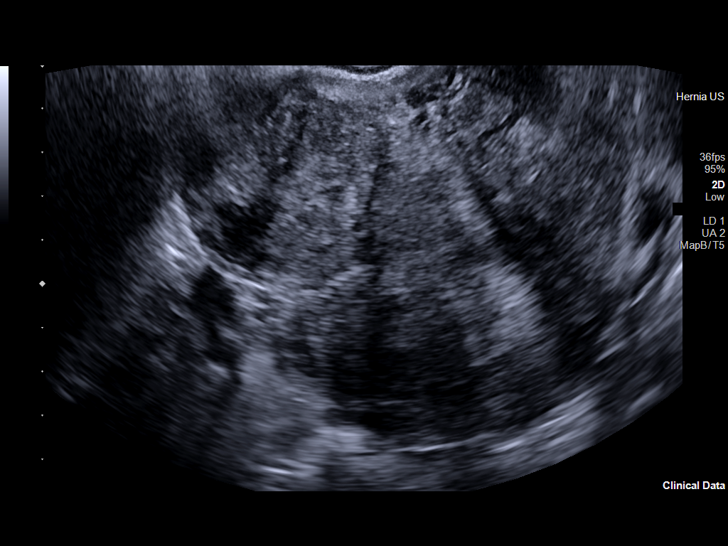
[im 55/73]
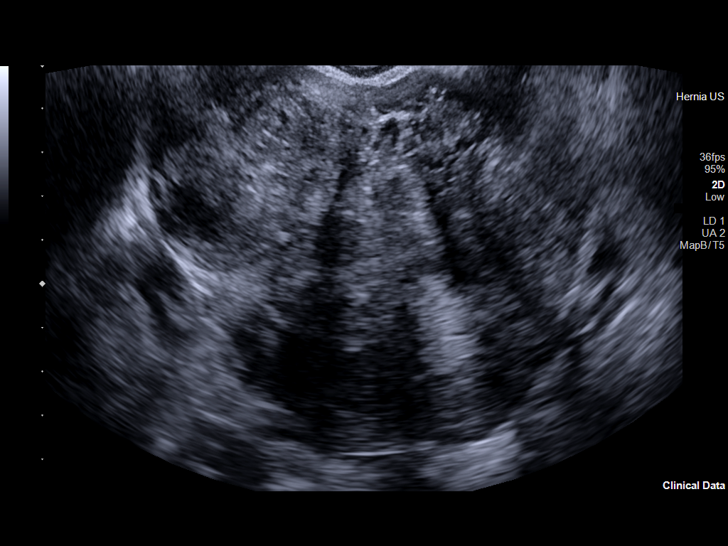
[im 61/73]
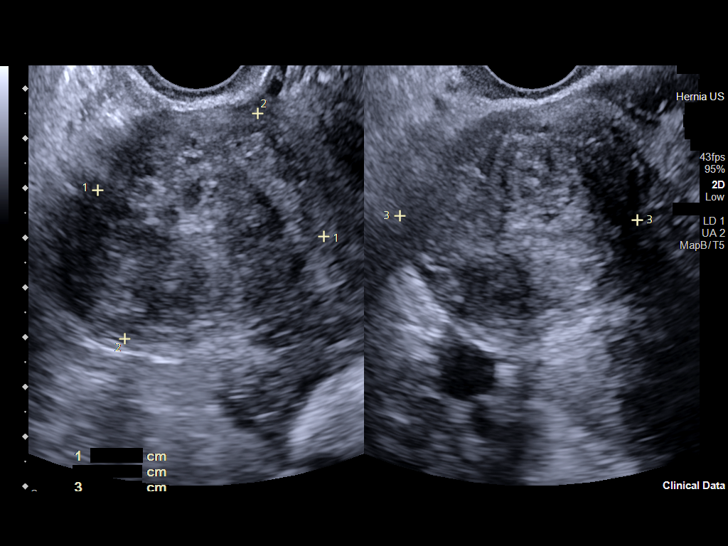
[im 67/73]
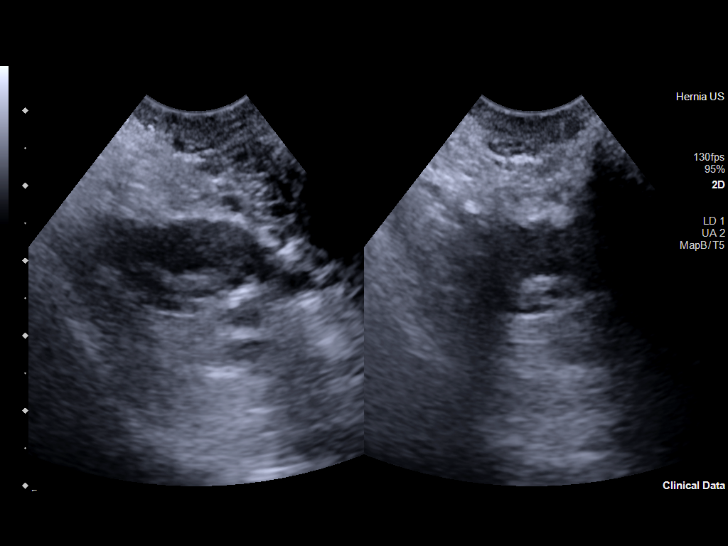
[im 73/73]
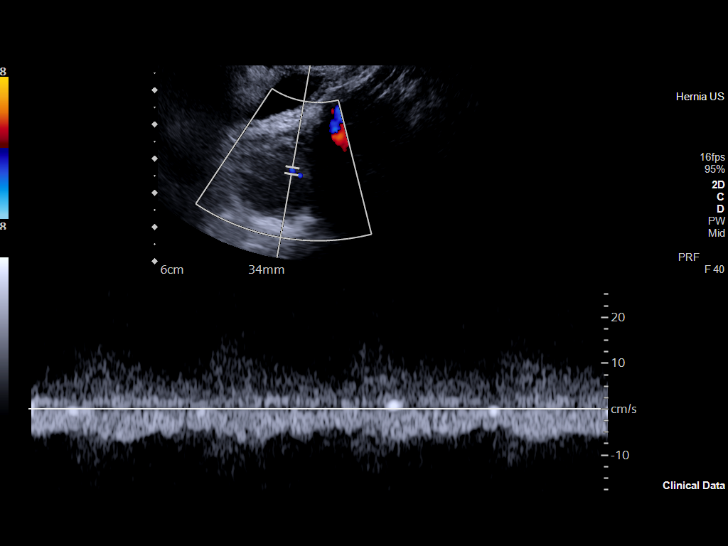

[13 of 25 positions shown; findings below may reference images not displayed]

FINDINGS: Uterus

Measurements: 8.6 x 5.7 x 8.4 cm = volume: 212 mL. Heterogeneous
myometrium. Multiple masses consistent with leiomyomata. Largest of
these is 5.8 cm diameter LEFT lateral uterus, likely transmural.
Additional transmural leiomyomata RIGHT lateral uterus 4.8 cm
diameter. Small subserosal leiomyoma at fundus 3.8 cm.

Endometrium

Thickness: Poorly visualized due to distortion of uterus by multiple
leiomyomata, segment 6 mm thick identified. No endometrial fluid.

Right ovary

Measurements: 2.2 x 1.3 x 1.8 cm = volume: 2.8 mL. Normal morphology
without mass

Left ovary

Measurements: 3.5 x 2.3 x 3.8 cm = volume: 15.6 mL. Heterogeneous.
Dominant physiologic follicle. Hypoechoic area 2.4 x 2.3 x 2.0 cm,
may represent heterogeneous parenchyma but difficult to exclude
hypoechoic nodule. Lesion has internal blood flow on color Doppler
imaging with low resistance arterial waveform on duplex imaging.

Other findings

Small amount of free pelvic fluid in cul-de-sac. No other adnexal
masses.
IMPRESSION: Multiple uterine leiomyomata, 2 of which likely extends submucosal.

Hypoechoic area within LEFT ovary 2.4 cm diameter, may represent
ovarian parenchyma but difficult to completely exclude a solid mass.

Either short-term follow-up ultrasound in 6-12 weeks to assess
persistence/stability or MR imaging with and without contrast to
characterize recommended.

## 2024-02-07 ENCOUNTER — Other Ambulatory Visit (HOSPITAL_BASED_OUTPATIENT_CLINIC_OR_DEPARTMENT_OTHER): Payer: Self-pay | Admitting: Obstetrics and Gynecology

## 2024-02-07 ENCOUNTER — Ambulatory Visit (HOSPITAL_BASED_OUTPATIENT_CLINIC_OR_DEPARTMENT_OTHER): Admission: RE | Admit: 2024-02-07 | Discharge: 2024-02-07 | Disposition: A | Source: Ambulatory Visit

## 2024-02-07 ENCOUNTER — Encounter (HOSPITAL_BASED_OUTPATIENT_CLINIC_OR_DEPARTMENT_OTHER): Payer: Self-pay | Admitting: Radiology

## 2024-02-07 DIAGNOSIS — Z1231 Encounter for screening mammogram for malignant neoplasm of breast: Secondary | ICD-10-CM | POA: Diagnosis present
